# Patient Record
Sex: Female | Born: 1965 | Race: Black or African American | Hispanic: No | Marital: Married | Smoking: Current every day smoker
Health system: Southern US, Community
[De-identification: ages and names within clinical notes are randomized; demographics above are authoritative.]

## PROBLEM LIST (undated history)

## (undated) DIAGNOSIS — I10 Essential (primary) hypertension: Secondary | ICD-10-CM

## (undated) DIAGNOSIS — F1010 Alcohol abuse, uncomplicated: Secondary | ICD-10-CM

## (undated) DIAGNOSIS — Z720 Tobacco use: Secondary | ICD-10-CM

## (undated) DIAGNOSIS — K625 Hemorrhage of anus and rectum: Secondary | ICD-10-CM

## (undated) DIAGNOSIS — E079 Disorder of thyroid, unspecified: Secondary | ICD-10-CM

## (undated) HISTORY — PX: COLONOSCOPY: SHX174

## (undated) HISTORY — DX: Essential (primary) hypertension: I10

## (undated) HISTORY — DX: Disorder of thyroid, unspecified: E07.9

## (undated) HISTORY — DX: Hemorrhage of anus and rectum: K62.5

## (undated) HISTORY — PX: THYROIDECTOMY: SHX17

---

## 1999-03-23 HISTORY — PX: UPPER GASTROINTESTINAL ENDOSCOPY: SHX188

## 2002-03-22 HISTORY — PX: HEMORROIDECTOMY: SUR656

## 2002-09-21 ENCOUNTER — Emergency Department (HOSPITAL_COMMUNITY): Admission: EM | Admit: 2002-09-21 | Discharge: 2002-09-21 | Payer: Self-pay | Admitting: Emergency Medicine

## 2002-11-02 ENCOUNTER — Ambulatory Visit (HOSPITAL_COMMUNITY): Admission: RE | Admit: 2002-11-02 | Discharge: 2002-11-02 | Payer: Self-pay | Admitting: Gastroenterology

## 2002-11-02 ENCOUNTER — Encounter (INDEPENDENT_AMBULATORY_CARE_PROVIDER_SITE_OTHER): Payer: Self-pay | Admitting: *Deleted

## 2002-11-07 ENCOUNTER — Ambulatory Visit (HOSPITAL_COMMUNITY): Admission: RE | Admit: 2002-11-07 | Discharge: 2002-11-07 | Payer: Self-pay | Admitting: Gastroenterology

## 2002-11-08 ENCOUNTER — Encounter: Payer: Self-pay | Admitting: Gastroenterology

## 2002-11-08 ENCOUNTER — Encounter (INDEPENDENT_AMBULATORY_CARE_PROVIDER_SITE_OTHER): Payer: Self-pay | Admitting: *Deleted

## 2002-11-08 ENCOUNTER — Ambulatory Visit (HOSPITAL_COMMUNITY): Admission: RE | Admit: 2002-11-08 | Discharge: 2002-11-08 | Payer: Self-pay | Admitting: Gastroenterology

## 2002-11-23 ENCOUNTER — Encounter (INDEPENDENT_AMBULATORY_CARE_PROVIDER_SITE_OTHER): Payer: Self-pay | Admitting: Specialist

## 2002-11-23 ENCOUNTER — Observation Stay (HOSPITAL_COMMUNITY): Admission: RE | Admit: 2002-11-23 | Discharge: 2002-11-24 | Payer: Self-pay | Admitting: General Surgery

## 2002-11-23 ENCOUNTER — Encounter: Payer: Self-pay | Admitting: General Surgery

## 2002-12-13 ENCOUNTER — Ambulatory Visit (HOSPITAL_COMMUNITY): Admission: RE | Admit: 2002-12-13 | Discharge: 2002-12-13 | Payer: Self-pay | Admitting: General Surgery

## 2002-12-13 ENCOUNTER — Encounter: Payer: Self-pay | Admitting: General Surgery

## 2004-07-29 ENCOUNTER — Emergency Department (HOSPITAL_COMMUNITY): Admission: EM | Admit: 2004-07-29 | Discharge: 2004-07-29 | Payer: Self-pay | Admitting: Emergency Medicine

## 2005-04-12 ENCOUNTER — Ambulatory Visit (HOSPITAL_COMMUNITY): Admission: RE | Admit: 2005-04-12 | Discharge: 2005-04-12 | Payer: Self-pay | Admitting: Obstetrics and Gynecology

## 2005-04-21 ENCOUNTER — Other Ambulatory Visit: Admission: RE | Admit: 2005-04-21 | Discharge: 2005-04-21 | Payer: Self-pay | Admitting: Obstetrics and Gynecology

## 2007-08-16 ENCOUNTER — Ambulatory Visit (HOSPITAL_COMMUNITY): Admission: RE | Admit: 2007-08-16 | Discharge: 2007-08-16 | Payer: Self-pay | Admitting: Pediatrics

## 2008-05-21 ENCOUNTER — Ambulatory Visit (HOSPITAL_COMMUNITY): Admission: RE | Admit: 2008-05-21 | Discharge: 2008-05-21 | Payer: Self-pay | Admitting: Family Medicine

## 2008-05-30 ENCOUNTER — Ambulatory Visit (HOSPITAL_COMMUNITY): Admission: RE | Admit: 2008-05-30 | Discharge: 2008-05-30 | Payer: Self-pay | Admitting: Family Medicine

## 2008-11-05 ENCOUNTER — Ambulatory Visit (HOSPITAL_COMMUNITY): Admission: RE | Admit: 2008-11-05 | Discharge: 2008-11-05 | Payer: Self-pay | Admitting: Obstetrics and Gynecology

## 2008-11-13 ENCOUNTER — Other Ambulatory Visit: Admission: RE | Admit: 2008-11-13 | Discharge: 2008-11-13 | Payer: Self-pay | Admitting: Obstetrics and Gynecology

## 2009-01-09 ENCOUNTER — Ambulatory Visit (HOSPITAL_COMMUNITY): Admission: RE | Admit: 2009-01-09 | Discharge: 2009-01-09 | Payer: Self-pay | Admitting: Family Medicine

## 2009-03-22 HISTORY — PX: ABDOMINAL HYSTERECTOMY: SHX81

## 2009-03-31 ENCOUNTER — Ambulatory Visit (HOSPITAL_COMMUNITY): Admission: RE | Admit: 2009-03-31 | Discharge: 2009-03-31 | Payer: Self-pay | Admitting: Obstetrics and Gynecology

## 2009-04-22 ENCOUNTER — Inpatient Hospital Stay (HOSPITAL_COMMUNITY)
Admission: RE | Admit: 2009-04-22 | Discharge: 2009-04-24 | Payer: Self-pay | Source: Home / Self Care | Admitting: Obstetrics and Gynecology

## 2009-04-22 ENCOUNTER — Encounter: Payer: Self-pay | Admitting: Obstetrics and Gynecology

## 2010-03-17 ENCOUNTER — Ambulatory Visit: Payer: Self-pay | Admitting: Internal Medicine

## 2010-04-09 ENCOUNTER — Ambulatory Visit (HOSPITAL_COMMUNITY): Admission: RE | Admit: 2010-04-09 | Payer: Self-pay | Source: Home / Self Care | Admitting: Internal Medicine

## 2010-04-11 ENCOUNTER — Encounter: Payer: Self-pay | Admitting: Family Medicine

## 2010-05-18 ENCOUNTER — Encounter: Payer: Self-pay | Admitting: Orthopedic Surgery

## 2010-05-18 ENCOUNTER — Ambulatory Visit (INDEPENDENT_AMBULATORY_CARE_PROVIDER_SITE_OTHER): Payer: 59 | Admitting: Orthopedic Surgery

## 2010-05-18 DIAGNOSIS — M6530 Trigger finger, unspecified finger: Secondary | ICD-10-CM | POA: Insufficient documentation

## 2010-05-20 ENCOUNTER — Encounter (HOSPITAL_BASED_OUTPATIENT_CLINIC_OR_DEPARTMENT_OTHER): Payer: 59 | Admitting: Internal Medicine

## 2010-05-20 ENCOUNTER — Other Ambulatory Visit (INDEPENDENT_AMBULATORY_CARE_PROVIDER_SITE_OTHER): Payer: Self-pay | Admitting: Internal Medicine

## 2010-05-20 ENCOUNTER — Ambulatory Visit (HOSPITAL_COMMUNITY)
Admission: RE | Admit: 2010-05-20 | Discharge: 2010-05-20 | Disposition: A | Discharge status: Home / Self Care | Payer: 59 | Source: Ambulatory Visit | Attending: Internal Medicine | Admitting: Internal Medicine

## 2010-05-20 DIAGNOSIS — K921 Melena: Secondary | ICD-10-CM | POA: Insufficient documentation

## 2010-05-20 DIAGNOSIS — I10 Essential (primary) hypertension: Secondary | ICD-10-CM | POA: Insufficient documentation

## 2010-05-20 DIAGNOSIS — Z86010 Personal history of colonic polyps: Secondary | ICD-10-CM

## 2010-05-20 DIAGNOSIS — K5900 Constipation, unspecified: Secondary | ICD-10-CM

## 2010-05-20 DIAGNOSIS — Z79899 Other long term (current) drug therapy: Secondary | ICD-10-CM | POA: Insufficient documentation

## 2010-05-20 DIAGNOSIS — D126 Benign neoplasm of colon, unspecified: Secondary | ICD-10-CM | POA: Insufficient documentation

## 2010-05-28 NOTE — Assessment & Plan Note (Signed)
Summary: trigger finger   Visit Type:  new patient Referring Provider:  self Primary Provider:  na  CC:  trigger finger.  History of Present Illness: I saw [name] in the office today for an initial visit.  She is a 45 years old woman with the complaint of:  right ring finger triggering.  No injections.  Levoxyl, Metoprolol, Setraline.  An 45 years old female with pain in the RIGHT ring finger since December of last year, which started gradually progressed to sharp, dull, 6/10 discomfort with triggering and locking.    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 Cardiovascular:  Denies chest pain, palpitations, fainting, and murmurs. Respiratory:  Denies short of breath, wheezing, couch, tightness, pain on inspiration, and snoring . Gastrointestinal:  Complains of blood in your stools; denies heartburn, nausea, vomiting, diarrhea, and constipation. Genitourinary:  Denies frequency, urgency, difficulty urinating, painful urination, flank pain, and bleeding in urine. Neurologic:  Denies numbness, tingling, unsteady gait, dizziness, tremors, and seizure. Musculoskeletal:  Complains of joint pain; denies swelling, instability, stiffness, redness, heat, and muscle pain. Endocrine:  Denies excessive thirst, exessive urination, and heat or cold intolerance. Psychiatric:  Complains of anxiety; denies nervousness, depression, and hallucinations. Skin:  Denies changes in the skin, poor healing, rash, itching, and redness. HEENT:  Denies blurred or double vision, eye pain, redness, and  watering. Immunology:  Denies seasonal allergies, sinus problems, and allergic to bee stings. Hemoatologic:  Denies easy bleeding and brusing.  Physical Exam  Skin:  intact without lesions or rashes Psych:  alert and cooperative; normal mood and affect; normal attention span and concentration   Wrist/Hand Exam  General:    Well-developed, well-nourished, in no acute distress; alert and oriented x 3.    Skin:    Intact with no erythema; no scarring.    Inspection:    swelling at the A1 pulley, RIGHT ring  Palpation:    tenderness on the A1 pulley of the RIGHT ring finger  Vascular:    normal perfusion of the RIGHT hand  Sensory:    normal sensation, RIGHT hand  Motor:    normal flexion, extension, fingers, RIGHT hand   Impression & Recommendations:  Problem # 1:  TRIGGER FINGER (ICD-727.03) Assessment New  trigger RIGHT ring finger.  Injection Verbal consent was obtained: The finger was prepped with ethyl chloride and injected with 1:1 injection of .25% sensorcaine, 1cc  and 40 mg of depomedrol, 1cc. There were no complications.  Orders: New Patient Level II ([address]) Injection, Tendon / Ligament (60454)  Patient Instructions: 1)  You have received an injection of cortisone today. You may experience increased pain at the injection site. Apply ice pack to the area for 20 minutes every 2 hours and take 2 xtra strength tylenol every 8 hours. This increased pain will usually resolve in 24 hours. The injection will take effect in 3-10 days.  2)  Please schedule a follow-up appointment as needed.   Orders Added: 1)  New Patient Level II [99202] 2)  Injection, Tendon / Ligament [20550]

## 2010-05-28 NOTE — Letter (Signed)
Summary: Work [facility]  [address]  [place], [place] [address]   Phone: [phone]  Fax: [phone]     Today's Date: May 18, 2010  Name of Patient: [name] [facility]  The above named patient had a medical visit today at:  4:15 pm.  Please take this into consideration when reviewing the time away from work.           Start:  05/19/10          End:  05/20/10  - return to the normal work schedule                Sincerely yours,   Terrance Mass, MD

## 2010-05-29 ENCOUNTER — Encounter: Payer: Self-pay | Admitting: Orthopedic Surgery

## 2010-05-29 ENCOUNTER — Ambulatory Visit (HOSPITAL_COMMUNITY)
Admission: RE | Admit: 2010-05-29 | Discharge: 2010-05-29 | Disposition: A | Discharge status: Home / Self Care | Payer: 59 | Source: Ambulatory Visit | Attending: Internal Medicine | Admitting: Internal Medicine

## 2010-05-29 DIAGNOSIS — I10 Essential (primary) hypertension: Secondary | ICD-10-CM | POA: Insufficient documentation

## 2010-05-29 DIAGNOSIS — Z79899 Other long term (current) drug therapy: Secondary | ICD-10-CM | POA: Insufficient documentation

## 2010-05-29 DIAGNOSIS — Y838 Other surgical procedures as the cause of abnormal reaction of the patient, or of later complication, without mention of misadventure at the time of the procedure: Secondary | ICD-10-CM | POA: Insufficient documentation

## 2010-05-29 DIAGNOSIS — IMO0002 Reserved for concepts with insufficient information to code with codable children: Secondary | ICD-10-CM

## 2010-06-01 LAB — CONVERTED CEMR LAB
MCV: 83.8 fL (ref 78.0–100.0)
Platelets: 262 10*3/uL (ref 150–400)
RBC: 3.76 M/uL — ABNORMAL LOW (ref 3.87–5.11)
WBC: 5.7 10*3/uL (ref 4.0–10.5)

## 2010-06-02 NOTE — Letter (Signed)
Summary: History form  History form   Imported By: Jacklynn Ganong 05/28/2010 16:15:38  _____________________________________________________________________  External Attachment:    Type:   Image     Comment:   External Document

## 2010-06-06 NOTE — Op Note (Signed)
  [name], [name]           ACCOUNT NO.:  [id number]  MEDICAL RECORD NO.:  [id number]           PATIENT TYPE:  O  LOCATION:  DAYP                          FACILITY:  [facility]  PHYSICIAN:  Lionel December, M.D.    DATE OF BIRTH:  1965-06-24  DATE OF PROCEDURE:  05/20/2010 DATE OF DISCHARGE:                              OPERATIVE REPORT   PROCEDURE:  Colonoscopy with snare polypectomy.  INDICATIONS:  The patient is a 45 year old African American female, is an Charity fundraiser, who had colonoscopy by Dr. Loreta Ave about 6 years ago with removal of 2 adenomas.  She was advised to return for repeat exam in 3 years, but she decided not to until recently when she noticed some blood with her bowel movements when she has been constipated.  Family history is negative for colorectal carcinoma.  Procedure risks were reviewed with the patient and informed consent was obtained.  MEDICATIONS FOR CONSCIOUS SEDATION:  Demerol 50 mg IV, Versed 10 mg IV.  FINDINGS:  Procedure performed in endoscopy suite.  The patient's vital signs and O2 saturations were monitored during the procedure and remained stable.  The patient was placed in left lateral recumbent position.  Rectal examination was performed.  No abnormality noted on external or digital exam.  Pentax videoscope was placed through rectum and advanced under vision into sigmoid colon and beyond.  Preparation was satisfactory.  Scope was passed to cecum which was identified by appendiceal orifice and ileocecal valve.  Pictures were taken for the record.  There was a 15-mm broad-based polyp on a fold at ascending colon.  This polyp was snared.  It was snared in 2 pieces and polypectomy was complete.  Smaller part was suctioned through the channel and other one was caught with Lucina Mellow net and retrieved.  Endoscope was passed again and rest of the colonic mucosa carefully examined.  No other polyps were noted.  Rectal mucosa was normal.  Scope was retroflexed to  examine anorectal junction and moderate-sized hemorrhoids noted below the dentate line.  Endoscope was straightened and withdrawn. Withdrawal time was 11.5 minutes.  The patient tolerated the procedure well.  FINAL DIAGNOSES: 1. Examination performed to cecum. 2. A 15-mm polyp snared from the ascending colon. 3. External hemorrhoids.  RECOMMENDATIONS: 1. Standard instructions given. 2. High-fiber diet. 3. No aspirin for 2 weeks.  I will be contacting the patient with results of biopsy and further recommendations.     Lionel December, M.D.     NR/MEDQ  D:  05/20/2010  T:  05/20/2010  Job:  [id number]  cc:   Francoise Schaumann. Benjamine Mola, FAAP Fax: [phone]  Electronically Signed by Lionel December M.D. on 06/06/2010 02:29:34 PM

## 2010-06-06 NOTE — Op Note (Signed)
NAMEVERNONA, PEAKE           ACCOUNT NO.:  [id number]  MEDICAL RECORD NO.:  [id number]           PATIENT TYPE:  O  LOCATION:  DAYP                          FACILITY:  [facility]  PHYSICIAN:  Lionel December, M.D.    DATE OF BIRTH:  04/09/1965  DATE OF PROCEDURE:  05/29/2010 DATE OF DISCHARGE:                              OPERATIVE REPORT   PROCEDURE:  Colonoscopy with 3 hemoclip application at polypectomy site.  INDICATION:  The patient is a 45 year old Caucasian female with polypectomy on May 20, 2010, with removal of a single polyp for her ascending colon via snare polypectomy.  She started to pass blood last night.  She has not had any postural symptoms.  She has experienced some bloating cramps.  Her H and H was checked as 10.5 and 31.5.  She is undergoing therapeutic procedure.  Procedure risks were reviewed with the patient and informed consent was obtained.  MEDICATIONS FOR CONSCIOUS SEDATION:  1,  Demerol 50 mg IV.1. Versed 10 mg IV.  FINDINGS:  Procedure performed in endoscopy suite.  The patient's vital signs and O2 sats were monitored during the procedure and remained stable.  The patient was placed in left lateral recumbent position and rectal examination performed.  No abnormality noted on external or digital exam.  Pentax videoscope was placed through rectum and advanced under vision into sigmoid colon and beyond.  She had blood tinged liquid throughout the colon in the region of ascending colon was little bit more concentrated.  Most of this suctioned out.  Scope was passed into cecum which was identified by appendiceal orifice and ileocecal valve. Short segment of GI was also examined and was normal.  Attention was then directed to the polypectomy site.  A 2 dot specks of reddish area, however, no active bleeding was noted.  Site was washed with jet of water and no bleeding was induced.  Nevertheless, I felt that she must have bled from the polypectomy site  and just stopped.  Three hemoclips were applied to the polypectomy site for hemostasis.  The polypectomy margins were coapted.  No bleeding was induced during this process. Pictures were taken for the record.  Rest of the mucosa was once again carefully examined and no other abnormalities were noted.  Most of the liquid was suctioned out.  The patient tolerated the procedure well.  FINAL DIAGNOSES: 1. Examination performed of the cecum and terminal ileum. 2. Blood-tinged prep or fluid throughout the colon. 3. No active bleeding noted from the polypectomy site.  Had couple of     red dots.  Three hemoclips applied for hemostasis.  RECOMMENDATIONS: 1. The patient advised to rest at home for the next 24 hours.  She can     resume her usual diet.  No aspirin or NSAIDs. 2. The patient advised to hold, not to undergo MRI until she has been     documented to have past these hemoclips.     Lionel December, M.D.     NR/MEDQ  D:  05/29/2010  T:  05/29/2010  Job:  [id number]  cc:   Francoise Schaumann. Benjamine Mola, FAAP Fax: [phone]  Electronically  Signed by Lionel December M.D. on 06/06/2010 02:30:06 PM

## 2010-06-07 LAB — CBC
HCT: 39 % (ref 36.0–46.0)
Hemoglobin: 12.4 g/dL (ref 12.0–15.0)
MCHC: 31.7 g/dL (ref 30.0–36.0)
Platelets: 202 10*3/uL (ref 150–400)
RDW: 28.2 % — ABNORMAL HIGH (ref 11.5–15.5)

## 2010-06-07 LAB — COMPREHENSIVE METABOLIC PANEL
Albumin: 4.3 g/dL (ref 3.5–5.2)
BUN: 10 mg/dL (ref 6–23)
Calcium: 10.3 mg/dL (ref 8.4–10.5)
Creatinine, Ser: 0.51 mg/dL (ref 0.4–1.2)
Glucose, Bld: 92 mg/dL (ref 70–99)
Potassium: 4.3 mEq/L (ref 3.5–5.1)
Total Protein: 7 g/dL (ref 6.0–8.3)

## 2010-06-07 LAB — TYPE AND SCREEN
ABO/RH(D): O POS
Antibody Screen: NEGATIVE

## 2010-06-07 LAB — HEMOGLOBIN AND HEMATOCRIT, BLOOD
HCT: 31.6 % — ABNORMAL LOW (ref 36.0–46.0)
Hemoglobin: 9.6 g/dL — ABNORMAL LOW (ref 12.0–15.0)

## 2010-06-07 LAB — HCG, QUANTITATIVE, PREGNANCY: hCG, Beta Chain, Quant, S: <2 m[IU]/mL (ref <5)

## 2010-06-10 LAB — DIFFERENTIAL
Band Neutrophils: 0 % (ref 0–10)
Basophils Absolute: 0 10*3/uL (ref 0.0–0.1)
Blasts: 0 %
Eosinophils Absolute: 0.1 10*3/uL (ref 0.0–0.7)
Lymphocytes Relative: 19 % (ref 12–46)
Monocytes Absolute: 0.1 10*3/uL (ref 0.1–1.0)
Monocytes Relative: 2 % — ABNORMAL LOW (ref 3–12)
Promyelocytes Absolute: 0 %
nRBC: 0 /100 WBC

## 2010-06-10 LAB — CBC
HCT: 33.6 % — ABNORMAL LOW (ref 36.0–46.0)
Hemoglobin: 10.8 g/dL — ABNORMAL LOW (ref 12.0–15.0)
MCV: 79.9 fL (ref 78.0–100.0)
Platelets: 184 10*3/uL (ref 150–400)
RBC: 4.2 MIL/uL (ref 3.87–5.11)
WBC: 7.8 10*3/uL (ref 4.0–10.5)

## 2010-07-02 ENCOUNTER — Other Ambulatory Visit (INDEPENDENT_AMBULATORY_CARE_PROVIDER_SITE_OTHER): Payer: Self-pay | Admitting: Internal Medicine

## 2010-07-02 LAB — CBC WITH DIFFERENTIAL/PLATELET
Basophils Relative: 2 % — ABNORMAL HIGH (ref 0–1)
Eosinophils Absolute: 0.2 10*3/uL (ref 0.0–0.7)
Lymphs Abs: 1 10*3/uL (ref 0.7–4.0)
MCH: 28.7 pg (ref 26.0–34.0)
MCHC: 32.7 g/dL (ref 30.0–36.0)
Neutro Abs: 3 10*3/uL (ref 1.7–7.7)
Neutrophils Relative %: 63 % (ref 43–77)
Platelets: 228 10*3/uL (ref 150–400)
RBC: 4.15 MIL/uL (ref 3.87–5.11)

## 2010-08-07 NOTE — Op Note (Signed)
NAME:  [name], [name]                     ACCOUNT NO.:  [id number]   MEDICAL RECORD NO.:  [id number]                   PATIENT TYPE:  AMB   LOCATION:                                       FACILITY:  [facility]   PHYSICIAN:  Anselmo Rod, M.D.               DATE OF BIRTH:  [birth year]   DATE OF PROCEDURE:  11/07/2002  DATE OF DISCHARGE:                                 OPERATIVE REPORT   PROCEDURE:  Esophagogastroduodenoscopy with biopsy.   ENDOSCOPIST:  Charna Elizabeth, M.D.   INSTRUMENT USED:  Olympus video panendoscope.   INDICATIONS FOR PROCEDURE:  45 year old African American female with a  history of rectal bleeding and polyp snared from the rectum and a small  lesion at the anal verge undergoing EGD to rule out peptic ulcer disease,  esophagitis, gastritis, etc.  The patient has had ongoing rectal bleeding.   PREPROCEDURE PREPARATION:  Informed consent was obtained from the patient.  The patient's hemoglobin was checked, it was 10.1 grams for the procedure.  The patient was fasted for four hours prior to the procedure.   PREPROCEDURE PHYSICAL:  Patient with stable vital signs.  Neck supple.  Chest clear to auscultation.  S1 and S2 regular.  Abdomen soft, obese, with  some right lower quadrant and periumbilical tenderness to palpation with  guarding, no rebound or rigidity.  No hepatosplenomegaly.  The patient has  some pigmentation on her skin from vitiligo.   DESCRIPTION OF PROCEDURE:  The patient was placed in the left lateral  decubitus position, sedated with 100 mg of Demerol and 8 mg Versed  intravenously.  Once the patient was adequately sedated, maintained on low  flow oxygen, continuous cardiac monitoring, the Olympus video panendoscope  was advanced through the mouth piece over the tongue into the esophagus  under direct vision.  The entire esophagus appeared normal with no evidence  of ring, strictures, masses, esophagitis, or Barrett's mucosa.  The scope  was  then advanced into the stomach.  There was significant inflammatory  change noted in the proximal portion of the stomach, fundus, high cardia,  and mid body.  The antrum appeared relatively healthy.  Multiple biopsies  were done.  The etiology of this seems to be unclear.  Biopsies were done to  rule out H. pylori versus IBD.  The duodenal bulb and proximal small bowel  distal to the bulb appeared normal up to 60 cm, there was no obstruction.  The patient tolerated the procedure well without complications.  No hiatal  hernia was seen.  A rectal exam was repeated after the EGD, the scope was  gently inserted into the rectum.  Well formed stool was noted in the rectum  with no evidence of fresh blood.  The patient had some pigmented changes in  the mucosa around the rectum from vitiligo.  No anal fissure was seen.  Some  hemorrhoids were identified, these were not  bleeding at the time of  examination.   IMPRESSION:  1. Severely, edematous, inflamed mucosa in the proximal stomach, biopsies     done, results pending.  2. Normal appearing esophagus and proximal small bowel.    RECOMMENDATIONS:  1. Await pathology results.  2. CT scan of the abdomen and pelvis to further evaluate the patient's pain     and anemia.  3. Further recommendations made thereafter.                                               Anselmo Rod, M.D.    JNM/MEDQ  D:  11/08/2002  T:  11/08/2002  Job:  [id number]   cc:   Margaretmary Bayley, M.D.  [address]  [place]  [place] [address]  Fax: [phone]   Adolph Pollack, M.D.  [address]  [place]  [place] [address]  Fax: [phone]

## 2010-08-07 NOTE — Op Note (Signed)
NAME:  [name], [name]                     ACCOUNT NO.:  [id number]   MEDICAL RECORD NO.:  [id number]                   PATIENT TYPE:  AMB   LOCATION:  ENDO                                 FACILITY:  [facility]   PHYSICIAN:  Anselmo Rod, M.D.               DATE OF BIRTH:  [birth year]   DATE OF PROCEDURE:  11/02/2002  DATE OF DISCHARGE:                                 OPERATIVE REPORT   PROCEDURE PERFORMED:  Colonoscopy with snare polypectomy x1.   ENDOSCOPIST:  Anselmo Rod, M.D.   INSTRUMENT USED:  Olympus video colonoscope.   INDICATION FOR PROCEDURE:  A 45 year old African-American female with a four-  month history of rectal bleeding, worsened in the last week.  The patient  gives a history of excessive bleeding with BMs a week ago and then today.  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 Sung Amabile, M.D.   PREPROCEDURE PREPARATION:  Informed consent was procured from the patient.  The patient was fasted for hours prior to the procedure and prepped with two  bottles of Fleet's Phospho-Soda and two Fleet's enemas the morning of the  procedure, as this was done on a semi-emergent basis.  The patient's basic  labs were checked, including a CBC, CMET, PT, and a PTT.  Her hemoglobin was  11.2, and coagulation studies were normal.   PREPROCEDURE PHYSICAL:  VITAL SIGNS:  The patient had stable vital signs.  HEENT:  [place], PERRLA.  Facial symmetry preserved.  NECK:  Supple with a surgical care present in the base of the neck from a  previous thyroidectomy.  CHEST:  Clear to auscultation.  S1, S2 regular.  ABDOMEN:  Soft, obese, nondistended, with no evidence of hepatosplenomegaly,  no masses palpable.  RECTAL:  Moderate sphincter tone with guaiac-positive brown stools, no other  masses palpable on digital examination.   DESCRIPTION OF PROCEDURE:  The patient was placed in the left lateral  decubitus position and sedated with 100 mg of  Demerol and 8 mg of Versed  intravenously in slow incremental doses.  Once the patient was adequately  sedate and maintained on low-flow oxygen and continuous cardiac monitoring,  the Olympus video colonoscope was advanced from the rectum to the cecum with  difficulty.  There was a large amount of stool in the colon, both formed and  liquid.  Multiple washes were done.  The patient's position was changed from  the left lateral to the supine and the right lateral position on several  occasions with gentle application of abdominal pressure to reach the cecum.  The appendiceal orifice and the ileocecal valve were visualized and  photographed.  Careful inspection was done but as there was inadequate prep,  small lesions could have been missed.  A small sessile polyp was snared from  the rectum.  Retroflexion in the rectum revealed papillomatous lesion at the  anal verge.  This was not biopsied or removed, as it seemed to lie over the  hemorrhoidal plexus.  Small internal hemorrhoids were noticed on  retroflexion as well.  There was no evidence of diverticulosis.  No other  masses or polyps were seen except for the ones mentioned above; however,  small lesions could be missed.   IMPRESSION:  1. Small sessile polyp snared from the rectum.  2. Papillomatous lesion at the anal verge, not biopsied or removed.  3. Internal hemorrhoid.   RECOMMENDATIONS:  1. Await pathology results.  2. Surgical evaluation for removal of the small lesion at the anal verge.  3. Continue serial CBCs and outpatient follow-up within the next week.                                               Anselmo Rod, M.D.    JNM/MEDQ  D:  11/03/2002  T:  11/03/2002  Job:  [id number]   cc:   Margaretmary Bayley, M.D.  [address], [address]  [place]  [place] [address]  Fax: [phone]

## 2010-08-07 NOTE — Consult Note (Signed)
NAME:  [name], [name]                     ACCOUNT NO.:  [id number]   MEDICAL RECORD NO.:  [id number]                   PATIENT TYPE:  AMB   LOCATION:  ENDO                                 FACILITY:  [facility]   PHYSICIAN:  Anselmo Rod, M.D.               DATE OF BIRTH:  [birth year]   DATE OF CONSULTATION:  11/02/2002  DATE OF DISCHARGE:                                   CONSULTATION   REASON FOR CONSULTATION:  Rectal bleeding for the last four months.   HISTORY OF PRESENT ILLNESS:  The patient is a 45 year old African American  female employee of [employer], referred to me by a coworker, Dr.  Juleen China, for emergency evaluation of hematochezia.  The patient gives  a four-month history of rectal bleeding.  She noted a small amount of blood  in her stools about four months ago on a couple of occasions, and then a  significant amount of rectal bleeding after a bowel movement about one week  ago.  She claims the bleeding filled up the commode and made her very  anxious and afraid of what the possibilities could be.  The patient denied  any melena associated with the stools, some right lower-quadrant discomfort,  but no nausea, vomiting, fever, chills or rigors.  The patient had  recurrence of these symptoms earlier today and requested emergency  evaluation.  She denied any dizziness or syncope.  She has no shortness of  breath, chest pain, genitourinary or cardiovascular complaints.  There is no  history of ENT, dental or ocular complaints. She has a history of headache,  but does not consume nonsteroidals in large amounts. This may occur about  once a month.  There are no C&S, psychiatric or allergic complaints.  She  has depigmentation of the skin, question of vitiligo for the last five or  six years.  She is being followed by Dr. Campbell Stall for the same, and  claims that her skin is improving with the application of topical  medications.   She denies a history of  reflux, dysphagia, odynophagia, ulcers, jaundice or  colitis.  She tends to have bouts of constipation and does not consume  enough fluid and fiber in her diet.  There is no history of hemorrhoids or  anal fissures, abnormal weight loss or weight gain except for a 15 pound  weight loss in the last couple of months after she started the Atkin's diet.  She has, however, stopped following the diet two weeks ago and may have  gained some of the weight back.  She denies history of anemia. Blood  transfusions or tattoos.   ALLERGIES:  No known drug allergies.   MEDICATIONS:  Levoxyl.   PAST MEDICAL HISTORY:  1. Two normal vaginal deliveries, 15 and 17 years ago.  2. Graves disease diagnosed seven years ago, resulting in thyroidectomy.  3. History of vitiligo under the care of Dr.  Hope Danella Deis.   FAMILY HISTORY:  Father died of renal failure complicated by myocardial  infarction at age 107.  Her mother is 63 and has hypertension.  She has three  younger brothers.  One of her brothers has renal insufficiency.  Another  brother has hypertension.  There is no known family history of breast,  ovarian, uterine or colon cancer.   REVIEW OF SYSTEMS:  1. Bright red blood per rectum for the last four months, worse in the last     two weeks.  2. No history of melena.  3. Some right lower quadrant discomfort in the recent past.  The patient     denies nausea, vomiting, fever, chills or rigors.  4. No dizziness, syncope, or near syncopal events.   PHYSICAL EXAMINATION:  GENERAL:  Reveals a very-pleasant, young [place]  American female, in no acute distress, very anxious and obviously afraid.  VITAL SIGNS:  Temperature 98.6, blood pressure 120/84, pulse 84 per minute,  respiratory rate 18.  HEENT:  Oropharyngeal mucosa without lesion.  NECK:  Supple, no JVD, thyromegaly or lymphadenopathy.  There is a surgical  scar present at the base of the neck from previous thyroidectomy.  CHEST:  Clear to  auscultation.  HEART:  S1 is regular, no murmur, rub or gallop.  ABDOMEN:  Obese, nondistended, nontender with normal bowel sounds, no  hepatosplenomegaly, no masses palpable.  RECTAL:  Examination with moderate sphincter tone, with brown, guaiac-  positive stool.  No other masses palpable on digital examination.  SKIN:  Deep pigmented in small patches over the middle of the lower torso,  arms and legs, question of vitiligo.   ASSESSMENT/PLAN:  1. Hematochezia.  Considering the patient's history of rectal bleeding in     the last four months, worse in the last couple of episodes and then again     today, an emergency colonoscopy has been planned for her.  Basic labs     were checked.  Hemoglobin was 11.2, hematocrit 33.7, MCV 76.9, white     count 4.9 K.  PT 13.4, INR 1.  PTT 31.  Sodium 145, potassium 3.8,     chloride 112, CO2 22, glucose 105, BUN 8, creatinine 0.6.  Total     bilirubin 0.4.  Direct bilirubin 0.1.  Alkaline phosphatase 78, AST 90,     ALT 16, total protein 7.5, albumin 4, calcium 3.1.   Further recommendations made after the colonoscopy has been done.  1. History of Graves disease, status post partial thyroidectomy, presently     on Levoxyl.  2. Obesity.  3. Vitiligo, under the care of Dr. Campbell Stall.                                               Anselmo Rod, M.D.    JNM/MEDQ  D:  11/03/2002  T:  11/03/2002  Job:  [id number]   cc:   Margaretmary Bayley, M.D.  [address], [address]  [place]  [place] [address]  Fax: [phone]   [facility]. Danella Deis, M.D.  [address]  [place]  [place] [address]  Fax: [phone]

## 2010-08-07 NOTE — Op Note (Signed)
NAME:  [name], [name]                     ACCOUNT NO.:  [id number]   MEDICAL RECORD NO.:  [id number]                   PATIENT TYPE:  OBV   LOCATION:  0444                                 FACILITY:  [facility]   PHYSICIAN:  Adolph Pollack, M.D.            DATE OF BIRTH:  Jul 24, 1965   DATE OF PROCEDURE:  11/23/2002  DATE OF DISCHARGE:                                 OPERATIVE REPORT   PREOPERATIVE DIAGNOSIS:  Rectal bleeding with anemia and internal  hemorrhoids.   POSTOPERATIVE DIAGNOSIS:  Rectal bleeding with anemia and internal  hemorrhoids with right posterior lateral anal mass.   OPERATION/PROCEDURE:  1. Examination under anesthesia with anoscopy.  2. Left lateral hemorrhoidectomy, external and internal.  3. Posterior column internal hemorrhoidectomy.  4. Rubber band ligation, right posterior lateral internal hemorrhoid.  5. Excision of right anterior lateral anal mass.   SURGEON:  Adolph Pollack, M.D.   ANESTHESIA:  General.   INDICATIONS:  This is a 45 year old female who has been having some rectal  bleeding.  She has had a thorough evaluation by Dr. Loreta Ave including  colonoscopy.  She has had a noted hemoglobin drop.  Most of the bleeding is  bright red blood.  No suspicious colorectal masses have been noted but  internal hemorrhoids have been noted and I noted them in my office.  She had  one inflamed when I saw her in the office.  She states for the past four  days, she has had not any further bleeding but her hemoglobin is down to  9.4.  Also of note on a preoperative chest x-ray, she has a vague density in  the right lung.  Did discuss this with her preoperatively in the holding  area.  She now presents for the above procedure.   DESCRIPTION OF PROCEDURE:  She is seen in the holding area, then brought to  the operating room and placed supine on the stretcher and the general  anesthesia was administered.  She is then turned prone on the operating  table  and appropriate areas padded.  She is placed in the jackknife  position.  The buttocks were retracted with tape and a perianal area was  sterilely prepped and draped.  Vitiligo-type changes were seen in the  perianal area.  On digital rectal exam, no masses were felt.  Anoscopy was  performed and I noted that in the left lateral area, she had a large  internal hemorrhoid with an external component as well.  I examined a right  posterolateral area and an internal hemorrhoid was noted there.  In the  right anterolateral area an irregular anal mass was noted.  There were an  extra hemorrhoidal column in the posterior region with a single internal  hemorrhoid.  I saw no lesions or no active bleeding.   I first approached the left lateral hemorrhoid.  I exposed it.  I used a 2-0  chromic suture to  ligate the vascular pedicle.  I then injected 0.5%  Marcaine with epinephrine and excised the hemorrhoid external and internal  component sharply.  Bleeding was controlled with the cautery.  I  reapproximated the mucosa and anoderm with a running locking 2-0 chromic  suture.  Next, I visualized the posterior column internal hemorrhoid.  I  went ahead and placed a hemostat across this base and excised it.  I then  used a single figure-of-eight to approximate the mucosa here which was  hemostatic.   The right posterolateral internal hemorrhoid was visualized.  It was fairly  focal and the rubber band was placed over the hemorrhoid with a rubber band  ligator.   Lastly, I examined the right anterior lateral mass which was irregular.  I  excised this with the cautery.   Following all this, the perianal block was 0.5% Marcaine with epinephrine  was performed.  Hemostasis was adequate.  A piece of Gelfoam into the rectal  vault.  A bulky dressing was applied.  She was subsequently returned back  supine on the operating table, extubated and taken to the recovery room in  satisfactory condition.  There  are no apparent complications.  Three  specimens were sent to pathology.  First the left lateral hemorrhoidal  specimen, the posterior column internal hemorrhoidal specimen, and the right  anterolateral anal mass.   PLAN:  I have given her instructions.  Will see her back in the office in  two weeks.  Prescription for Tylox was given.                                               Adolph Pollack, M.D.    Kari Baars  D:  11/23/2002  T:  11/23/2002  Job:  [id number]

## 2010-09-29 ENCOUNTER — Other Ambulatory Visit (HOSPITAL_COMMUNITY): Payer: Self-pay | Admitting: Pediatrics

## 2010-09-29 DIAGNOSIS — Z139 Encounter for screening, unspecified: Secondary | ICD-10-CM

## 2010-10-06 ENCOUNTER — Ambulatory Visit (HOSPITAL_COMMUNITY)
Admission: RE | Admit: 2010-10-06 | Discharge: 2010-10-06 | Disposition: A | Discharge status: Home / Self Care | Payer: 59 | Source: Ambulatory Visit | Attending: Pediatrics | Admitting: Pediatrics

## 2010-10-06 ENCOUNTER — Ambulatory Visit (HOSPITAL_COMMUNITY): Admission: RE | Admit: 2010-10-06 | Payer: 59 | Source: Ambulatory Visit

## 2010-10-06 DIAGNOSIS — Z139 Encounter for screening, unspecified: Secondary | ICD-10-CM

## 2010-10-06 DIAGNOSIS — Z1231 Encounter for screening mammogram for malignant neoplasm of breast: Secondary | ICD-10-CM | POA: Insufficient documentation

## 2012-06-13 ENCOUNTER — Other Ambulatory Visit (HOSPITAL_COMMUNITY): Payer: Self-pay | Admitting: Pulmonary Disease

## 2012-06-13 ENCOUNTER — Ambulatory Visit (HOSPITAL_COMMUNITY)
Admission: RE | Admit: 2012-06-13 | Discharge: 2012-06-13 | Disposition: A | Discharge status: Home / Self Care | Payer: 59 | Source: Ambulatory Visit | Attending: Pulmonary Disease | Admitting: Pulmonary Disease

## 2012-06-13 DIAGNOSIS — Z1231 Encounter for screening mammogram for malignant neoplasm of breast: Secondary | ICD-10-CM | POA: Insufficient documentation

## 2012-06-13 DIAGNOSIS — Z139 Encounter for screening, unspecified: Secondary | ICD-10-CM

## 2012-07-18 ENCOUNTER — Encounter (INDEPENDENT_AMBULATORY_CARE_PROVIDER_SITE_OTHER): Payer: Self-pay

## 2013-06-19 ENCOUNTER — Encounter: Payer: Self-pay | Admitting: Obstetrics and Gynecology

## 2013-06-20 ENCOUNTER — Telehealth: Payer: Self-pay | Admitting: Obstetrics and Gynecology

## 2013-06-20 NOTE — Telephone Encounter (Signed)
Pt seen yesterday [facility], and pt had URI symptoms warranting not continuing work duties. Given note to be out of work til 06/20/13. Pt has not improved adequately , and has not RTW. Pt asked to call office to discuss RTW.

## 2013-07-03 DIAGNOSIS — Z029 Encounter for administrative examinations, unspecified: Secondary | ICD-10-CM

## 2013-11-20 ENCOUNTER — Ambulatory Visit (INDEPENDENT_AMBULATORY_CARE_PROVIDER_SITE_OTHER): Payer: 59 | Admitting: Internal Medicine

## 2013-11-20 ENCOUNTER — Encounter (INDEPENDENT_AMBULATORY_CARE_PROVIDER_SITE_OTHER): Payer: Self-pay | Admitting: Internal Medicine

## 2013-11-20 VITALS — BP 118/80 | HR 76 | Temp 97.9°F | Resp 18 | Ht 65.5 in | Wt 162.2 lb

## 2013-11-20 DIAGNOSIS — I10 Essential (primary) hypertension: Secondary | ICD-10-CM | POA: Insufficient documentation

## 2013-11-20 DIAGNOSIS — E039 Hypothyroidism, unspecified: Secondary | ICD-10-CM | POA: Insufficient documentation

## 2013-11-20 DIAGNOSIS — R1032 Left lower quadrant pain: Secondary | ICD-10-CM

## 2013-11-20 DIAGNOSIS — K625 Hemorrhage of anus and rectum: Secondary | ICD-10-CM

## 2013-11-20 DIAGNOSIS — R1013 Epigastric pain: Secondary | ICD-10-CM

## 2013-11-20 DIAGNOSIS — R1031 Right lower quadrant pain: Secondary | ICD-10-CM

## 2013-11-20 DIAGNOSIS — Z860100 Personal history of colon polyps, unspecified: Secondary | ICD-10-CM | POA: Insufficient documentation

## 2013-11-20 DIAGNOSIS — R634 Abnormal weight loss: Secondary | ICD-10-CM

## 2013-11-20 LAB — CBC
HEMATOCRIT: 38.5 % (ref 36.0–46.0)
HEMOGLOBIN: 13 g/dL (ref 12.0–15.0)
MCH: 27 pg (ref 26.0–34.0)
MCHC: 33.8 g/dL (ref 30.0–36.0)
MCV: 79.9 fL (ref 78.0–100.0)
Platelets: 233 10*3/uL (ref 150–400)
RBC: 4.82 MIL/uL (ref 3.87–5.11)
RDW: 15.3 % (ref 11.5–15.5)
WBC: 4.6 10*3/uL (ref 4.0–10.5)

## 2013-11-20 MED ORDER — DICYCLOMINE HCL 20 MG PO TABS: 10.0000 mg | ORAL_TABLET | Freq: Three times a day (TID) | ORAL | Status: DC

## 2013-11-20 MED ORDER — HYDROCORTISONE ACETATE 25 MG RE SUPP: 25.0000 mg | Freq: Every day | RECTAL | Status: DC

## 2013-11-20 MED ORDER — DOCUSATE SODIUM 100 MG PO CAPS: 200.0000 mg | ORAL_CAPSULE | Freq: Every day | ORAL | Status: AC

## 2013-11-20 MED ORDER — DOCUSATE SODIUM 100 MG PO CAPS: 100.0000 mg | ORAL_CAPSULE | Freq: Every day | ORAL | Status: DC

## 2013-11-20 NOTE — Progress Notes (Signed)
Presenting complaint;  Abdominal pain of 3 months duration. Rectal bleeding and weight loss.  History of present illness;  Patient is a 48 year old African female who called the office yesterday and requested to be seen. I last saw her for colonoscopy with polypectomy and Fabry  She presents with 3 months history of pain across her lower abdomen and also in the epigastric region. Pain has gotten worse over the last 2 weeks. On few occasions pain has doubled her over primary located in right lower quadrant. She also has woken up with this pain at night. She has had frequent nausea poor appetite denies vomiting. She states she has lost 25 pounds in one year. She states weight loss is both voluntary and involuntary. She also complains of intermittent constipation and urgency. She's had 4 episodes of rectal bleeding in the last 3 months. On 2 occasions all she passed was moderate amount of fresh blood. She's had chills but no fever. She denies heartburn or dysphagia. She states she is under a lot of stress on account of her mother's illness and she has to attend to her needs. Her husband is unemployed and this is also source of worry for her. She is on alprazolam. She states is not helping and she has an appointment to see Dr. Luan Pulling later this month.  Current Medications: Outpatient Encounter Prescriptions as of 11/20/2013  Medication Sig  . ALPRAZolam (XANAX) 0.5 MG tablet Take 0.5 mg by mouth 3 (three) times daily as needed for anxiety.  . diphenhydrAMINE (BENADRYL) 25 MG tablet Take 25 mg by mouth as needed for allergies.  Marland Kitchen levothyroxine (SYNTHROID, LEVOTHROID) 200 MCG tablet Take 200 mcg by mouth daily before breakfast.  . metoprolol succinate (TOPROL-XL) 25 MG 24 hr tablet Take 25 mg by mouth daily.   Past medical history;  She had subtotal thyroidectomy for goiter about 20 years ago. His interval gland was destroyed with radio iodine and she has been on replacement therapy ever  since.  History of colonic adenomas. She had 2 adenomas removed colon in 2006(Dr.Mann). Last colonoscopy was in any pain on 05/20/2010 with removal of 15 mm tubular adenoma from ascending colon complicated by delayed bleeding 10 days later controlled with Hemoclip application. She did not require admission or transfusion.  History of vitiligo.  Hypertension of 2 years duration.  Hemorrhoidectomy in 2004.  Total abdominal hysterectomy in February 2011.  Allergies; Allergies  Allergen Reactions  . Morphine And Related Itching  . Penicillins Other (See Comments)    Patient states that it burned while being injected.    Family history; Father died of MI at age 62. He had gout and was on hemodialysis. Mother is 8 and has early dementia diabetes and hypertension. She has 3 brothers. 2 have hypertension and gout and one is in good health.  Social history; She is married and has 2 grownup sons. She's been working at: [employer] for the last 15 years and(presently at [employer]). She smokes about half a pack of cigarettes that she is on for 7 years. She drinks alcohol occasionally.   Objective: Blood pressure 118/80, pulse 76, temperature 97.9 F (36.6 C), temperature source Oral, resp. rate 18, height 5' 5.5" (1.664 m), weight 162 lb 3.2 oz (73.573 kg).  Patient is alert and in no acute distress. She has hypopigmented areas over her face and both hands. Conjunctiva is pink. Sclera is nonicteric Oropharyngeal mucosa is normal. No neck masses or thyromegaly noted. Cardiac exam with regular rhythm normal S1 and  S2. No murmur or gallop noted. Lungs are clear to auscultation. Abdomen is symmetrical. Bowel sounds are normal. No bruits noted. On palpation she has mild to moderate tenderness in right lower quadrant as well as mild tenderness epigastrium and left lower quadrant. No organomegaly or masses. Rectal examination deferred.  No LE edema or clubbing noted.    Assessment:  #1. Abdominal  pain. She has both epigastric and lower abdominal pain but most of her pain is concentrated in the right lower quadrant. Some of the features suggest irritable bowel syndrome but anorexia and weight loss is bothersome. #2. Rectal bleeding. She has history of colonic adenomas and last colonoscopy was in February 2012. Suspect bleeding is secondary to hemorrhoids. If bleeding persists she may need colonoscopy now rather than and February 2017 as planned. #3. Stress disorder relating to family matters. She is to see Dr. Luan Pulling and near future.   Plan:  Patient will go to the lab for CBC and comprehensive chemistry panel. Abdominopelvic CT with contrast. Dicyclomine 10 mg before each meal. Anusol-HC suppository 1 per rectum daily at bedtime for two weeks. Office visit in 8 weeks or earlier if needed.

## 2013-11-21 LAB — COMPREHENSIVE METABOLIC PANEL
ALBUMIN: 4.4 g/dL (ref 3.5–5.2)
ALT: 39 U/L — ABNORMAL HIGH (ref 0–35)
AST: 42 U/L — ABNORMAL HIGH (ref 0–37)
Alkaline Phosphatase: 72 U/L (ref 39–117)
BILIRUBIN TOTAL: 1.1 mg/dL (ref 0.2–1.2)
BUN: 11 mg/dL (ref 6–23)
CO2: 29 meq/L (ref 19–32)
Calcium: 10 mg/dL (ref 8.4–10.5)
Chloride: 98 mEq/L (ref 96–112)
Creat: 0.48 mg/dL — ABNORMAL LOW (ref 0.50–1.10)
GLUCOSE: 116 mg/dL — AB (ref 70–99)
Potassium: 4.3 mEq/L (ref 3.5–5.3)
Sodium: 136 mEq/L (ref 135–145)
Total Protein: 7.3 g/dL (ref 6.0–8.3)

## 2013-11-27 ENCOUNTER — Ambulatory Visit (HOSPITAL_COMMUNITY)
Admission: RE | Admit: 2013-11-27 | Discharge: 2013-11-27 | Disposition: A | Discharge status: Home / Self Care | Payer: 59 | Source: Ambulatory Visit | Attending: Internal Medicine | Admitting: Internal Medicine

## 2013-11-27 DIAGNOSIS — R109 Unspecified abdominal pain: Secondary | ICD-10-CM | POA: Insufficient documentation

## 2013-11-27 DIAGNOSIS — R1013 Epigastric pain: Secondary | ICD-10-CM

## 2013-11-27 DIAGNOSIS — R1031 Right lower quadrant pain: Secondary | ICD-10-CM

## 2013-11-27 DIAGNOSIS — R1032 Left lower quadrant pain: Secondary | ICD-10-CM

## 2013-11-27 DIAGNOSIS — K921 Melena: Secondary | ICD-10-CM | POA: Insufficient documentation

## 2013-11-27 DIAGNOSIS — R634 Abnormal weight loss: Secondary | ICD-10-CM

## 2013-11-27 MED ORDER — SODIUM CHLORIDE 0.9 % IJ SOLN
INTRAMUSCULAR | Status: AC
  Filled 2013-11-27: qty 30

## 2013-11-27 MED ORDER — SODIUM CHLORIDE 0.9 % IJ SOLN
INTRAMUSCULAR | Status: AC
  Filled 2013-11-27: qty 250

## 2013-11-27 MED ORDER — IOHEXOL 300 MG/ML  SOLN
100.0000 mL | Freq: Once | INTRAMUSCULAR | Status: AC | PRN
  Administered 2013-11-27: 100 mL via INTRAVENOUS

## 2013-11-29 ENCOUNTER — Telehealth (INDEPENDENT_AMBULATORY_CARE_PROVIDER_SITE_OTHER): Payer: Self-pay | Admitting: *Deleted

## 2013-11-29 DIAGNOSIS — R109 Unspecified abdominal pain: Secondary | ICD-10-CM

## 2013-11-29 DIAGNOSIS — K921 Melena: Secondary | ICD-10-CM

## 2013-11-29 NOTE — Telephone Encounter (Signed)
Per Dr.Rehman the patient will need to have labs drawn in 4 weeks.   

## 2013-12-12 ENCOUNTER — Encounter (INDEPENDENT_AMBULATORY_CARE_PROVIDER_SITE_OTHER): Payer: Self-pay | Admitting: *Deleted

## 2013-12-12 ENCOUNTER — Other Ambulatory Visit (INDEPENDENT_AMBULATORY_CARE_PROVIDER_SITE_OTHER): Payer: Self-pay | Admitting: *Deleted

## 2013-12-12 DIAGNOSIS — R109 Unspecified abdominal pain: Secondary | ICD-10-CM

## 2013-12-12 DIAGNOSIS — K921 Melena: Secondary | ICD-10-CM

## 2014-01-17 ENCOUNTER — Ambulatory Visit (INDEPENDENT_AMBULATORY_CARE_PROVIDER_SITE_OTHER): Payer: 59 | Admitting: Internal Medicine

## 2014-04-11 ENCOUNTER — Emergency Department (HOSPITAL_COMMUNITY)
Admission: EM | Admit: 2014-04-11 | Discharge: 2014-04-11 | Disposition: A | Discharge status: Home / Self Care | Payer: 59 | Attending: Emergency Medicine | Admitting: Emergency Medicine

## 2014-04-11 ENCOUNTER — Encounter (HOSPITAL_COMMUNITY): Payer: Self-pay

## 2014-04-11 DIAGNOSIS — Z7952 Long term (current) use of systemic steroids: Secondary | ICD-10-CM | POA: Diagnosis not present

## 2014-04-11 DIAGNOSIS — R112 Nausea with vomiting, unspecified: Secondary | ICD-10-CM | POA: Insufficient documentation

## 2014-04-11 DIAGNOSIS — Z79899 Other long term (current) drug therapy: Secondary | ICD-10-CM | POA: Insufficient documentation

## 2014-04-11 DIAGNOSIS — Z9049 Acquired absence of other specified parts of digestive tract: Secondary | ICD-10-CM | POA: Insufficient documentation

## 2014-04-11 DIAGNOSIS — Z720 Tobacco use: Secondary | ICD-10-CM | POA: Insufficient documentation

## 2014-04-11 DIAGNOSIS — E079 Disorder of thyroid, unspecified: Secondary | ICD-10-CM | POA: Diagnosis not present

## 2014-04-11 DIAGNOSIS — R51 Headache: Secondary | ICD-10-CM | POA: Diagnosis not present

## 2014-04-11 DIAGNOSIS — R1084 Generalized abdominal pain: Secondary | ICD-10-CM | POA: Diagnosis present

## 2014-04-11 DIAGNOSIS — Z8719 Personal history of other diseases of the digestive system: Secondary | ICD-10-CM | POA: Diagnosis not present

## 2014-04-11 DIAGNOSIS — I10 Essential (primary) hypertension: Secondary | ICD-10-CM | POA: Diagnosis not present

## 2014-04-11 DIAGNOSIS — E860 Dehydration: Secondary | ICD-10-CM | POA: Diagnosis not present

## 2014-04-11 DIAGNOSIS — Z880 Allergy status to penicillin: Secondary | ICD-10-CM | POA: Diagnosis not present

## 2014-04-11 DIAGNOSIS — R079 Chest pain, unspecified: Secondary | ICD-10-CM | POA: Insufficient documentation

## 2014-04-11 DIAGNOSIS — R197 Diarrhea, unspecified: Secondary | ICD-10-CM | POA: Insufficient documentation

## 2014-04-11 DIAGNOSIS — Z9889 Other specified postprocedural states: Secondary | ICD-10-CM | POA: Insufficient documentation

## 2014-04-11 DIAGNOSIS — R1110 Vomiting, unspecified: Secondary | ICD-10-CM

## 2014-04-11 LAB — CBC WITH DIFFERENTIAL/PLATELET
BASOS PCT: 1 % (ref 0–1)
Basophils Absolute: 0.1 10*3/uL (ref 0.0–0.1)
EOS PCT: 1 % (ref 0–5)
Eosinophils Absolute: 0 10*3/uL (ref 0.0–0.7)
HCT: 38.7 % (ref 36.0–46.0)
Hemoglobin: 12.9 g/dL (ref 12.0–15.0)
Lymphocytes Relative: 16 % (ref 12–46)
Lymphs Abs: 1 10*3/uL (ref 0.7–4.0)
MCH: 28.5 pg (ref 26.0–34.0)
MCHC: 33.3 g/dL (ref 30.0–36.0)
MCV: 85.4 fL (ref 78.0–100.0)
MONO ABS: 0.5 10*3/uL (ref 0.1–1.0)
MONOS PCT: 8 % (ref 3–12)
Neutro Abs: 5 10*3/uL (ref 1.7–7.7)
Neutrophils Relative %: 74 % (ref 43–77)
PLATELETS: 245 10*3/uL (ref 150–400)
RBC: 4.53 MIL/uL (ref 3.87–5.11)
RDW: 15.5 % (ref 11.5–15.5)
WBC: 6.6 10*3/uL (ref 4.0–10.5)

## 2014-04-11 LAB — COMPREHENSIVE METABOLIC PANEL
ALT: 29 U/L (ref 0–35)
AST: 25 U/L (ref 0–37)
Albumin: 3.9 g/dL (ref 3.5–5.2)
Alkaline Phosphatase: 57 U/L (ref 39–117)
Anion gap: 11 (ref 5–15)
BUN: 11 mg/dL (ref 6–23)
CALCIUM: 8.7 mg/dL (ref 8.4–10.5)
CO2: 29 mmol/L (ref 19–32)
CREATININE: 0.51 mg/dL (ref 0.50–1.10)
Chloride: 96 mEq/L (ref 96–112)
GLUCOSE: 112 mg/dL — AB (ref 70–99)
POTASSIUM: 3.3 mmol/L — AB (ref 3.5–5.1)
Sodium: 136 mmol/L (ref 135–145)
Total Bilirubin: 1.1 mg/dL (ref 0.3–1.2)
Total Protein: 6.6 g/dL (ref 6.0–8.3)

## 2014-04-11 LAB — URINE MICROSCOPIC-ADD ON

## 2014-04-11 LAB — URINALYSIS, ROUTINE W REFLEX MICROSCOPIC
Glucose, UA: NEGATIVE mg/dL
HGB URINE DIPSTICK: NEGATIVE
Ketones, ur: 15 mg/dL — AB
Leukocytes, UA: NEGATIVE
Nitrite: NEGATIVE
Protein, ur: 100 mg/dL — AB
Specific Gravity, Urine: >1.03 — ABNORMAL HIGH (ref 1.005–1.030)
UROBILINOGEN UA: 1 mg/dL (ref 0.0–1.0)
pH: 6 (ref 5.0–8.0)

## 2014-04-11 LAB — LIPASE, BLOOD: LIPASE: 19 U/L (ref 11–59)

## 2014-04-11 MED ORDER — ONDANSETRON HCL 4 MG/2ML IJ SOLN
4.0000 mg | Freq: Once | INTRAMUSCULAR | Status: AC
  Administered 2014-04-11: 4 mg via INTRAVENOUS
  Filled 2014-04-11: qty 2

## 2014-04-11 MED ORDER — HYDROMORPHONE HCL 1 MG/ML IJ SOLN
1.0000 mg | Freq: Once | INTRAMUSCULAR | Status: AC
  Administered 2014-04-11: 1 mg via INTRAVENOUS
  Filled 2014-04-11: qty 1

## 2014-04-11 MED ORDER — SODIUM CHLORIDE 0.9 % IV BOLUS (SEPSIS)
1000.0000 mL | Freq: Once | INTRAVENOUS | Status: AC
  Administered 2014-04-11: 1000 mL via INTRAVENOUS

## 2014-04-11 MED ORDER — FENTANYL CITRATE 0.05 MG/ML IJ SOLN
100.0000 ug | Freq: Once | INTRAMUSCULAR | Status: AC
  Administered 2014-04-11: 100 ug via INTRAVENOUS
  Filled 2014-04-11: qty 2

## 2014-04-11 MED ORDER — ONDANSETRON HCL 8 MG PO TABS: 8.0000 mg | ORAL_TABLET | Freq: Three times a day (TID) | ORAL | Status: AC | PRN

## 2014-04-11 NOTE — ED Notes (Signed)
MD at bedside. 

## 2014-04-11 NOTE — ED Provider Notes (Signed)
CSN: [id number]     Arrival date & time 04/11/14  7564 History  This chart was scribed for Sharyon Cable, MD by Edison Simon, ED Scribe. This patient was seen in room APA06/APA06 and the patient's care was started at 7:55 AM.    Chief Complaint  Patient presents with  . Abdominal Pain   Patient is a 49 y.o. female presenting with abdominal pain. The history is provided by the patient. No language interpreter was used.  Abdominal Pain Pain location:  Generalized Pain quality: cramping and sharp   Pain radiates to:  Does not radiate Pain severity:  Severe Onset quality:  Sudden Duration:  12 hours Timing:  Intermittent Progression:  Unchanged Chronicity:  New Context: awakening from sleep and eating   Relieved by:  None tried Worsened by:  Nothing tried Ineffective treatments:  None tried Associated symptoms: chest pain, diarrhea, nausea and vomiting   Associated symptoms: no dysuria, no fever and no shortness of breath   Risk factors: no recent hospitalization     HPI Comments: [name] is a 49 y.o. female who presents to the Emergency Department complaining of sharp, intermittent abdominal pain with associated nausea, vomiting, and diarrhea with onset last night. She states she had a dinner of fried potatoes and onions and a baloney sandwich then fell asleep while watching television, then was woken from her sleep by abdominal pain. She currently rates her pain at 8/10. She indicates that the pain affects her abdomen diffusely but is worse and cramping in her lower abdomen. She reports associated headache and chest pain which she suspects is due to vomiting. She denies blood or black material in her emesis. She states she has had 5-6 counts of diarrhea. He reports prior hysterectomy. She denies recent hospitalization or antibiotic use. 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 She denies fever, SOB, dysuria, or back pain.  Past Medical History  Diagnosis Date   . Hypertension   . Thyroid disorder   . Rectal bleed    Past Surgical History  Procedure Laterality Date  . Abdominal hysterectomy  2011  . Colonoscopy    . Upper gastrointestinal endoscopy  2001    [facility]/ Dr.Mann  . Hemorroidectomy  2004  . Thyroidectomy     Family History  Problem Relation Age of Onset  . Hypertension Mother   . Diabetes Mother   . Anxiety disorder Mother   . Heart disease Father   . Kidney disease Father   . Healthy Brother   . Healthy Paternal Grandfather   . Hypertension Brother   . Gout Brother   . Diverticulosis Brother   . Hypertension Brother   . Gout Brother   . Healthy Son    History  Substance Use Topics  . Smoking status: Current Every Day Smoker  . Smokeless tobacco: Never Used  . Alcohol Use: Yes     Comment: Social   OB History    No data available     Review of Systems  Constitutional: Negative for fever.  Respiratory: Negative for shortness of breath.   Cardiovascular: Positive for chest pain.  Gastrointestinal: Positive for nausea, vomiting, abdominal pain and diarrhea.  Genitourinary: Negative for dysuria.  Musculoskeletal: Negative for back pain.  Neurological: Positive for headaches.  All other systems reviewed and are negative.     Allergies  Morphine and related and Penicillins  Home Medications   Prior to Admission medications   Medication Sig Start Date End Date  Taking? Authorizing Provider  ALPRAZolam Duanne Moron) 0.5 MG tablet Take 0.5 mg by mouth 3 (three) times daily as needed for anxiety.    Historical Provider, MD  dicyclomine (BENTYL) 20 MG tablet Take 0.5 tablets (10 mg total) by mouth 3 (three) times daily before meals. 11/20/13   Rogene Houston, MD  diphenhydrAMINE (BENADRYL) 25 MG tablet Take 25 mg by mouth as needed for allergies.    Historical Provider, MD  docusate sodium (COLACE) 100 MG capsule Take 2 capsules (200 mg total) by mouth daily. 11/20/13   Rogene Houston, MD  hydrocortisone (ANUSOL-HC) 25  MG suppository Place 1 suppository (25 mg total) rectally at bedtime. 11/20/13   Rogene Houston, MD  levothyroxine (SYNTHROID, LEVOTHROID) 200 MCG tablet Take 200 mcg by mouth daily before breakfast.    Historical Provider, MD  metoprolol succinate (TOPROL-XL) 25 MG 24 hr tablet Take 25 mg by mouth daily.    Historical Provider, MD   BP 160/100 mmHg  Pulse 99  Temp(Src) 98.5 F (36.9 C) (Oral)  Resp 20  Ht 5\' 5"  (1.651 m)  Wt 172 lb (78.019 kg)  BMI 28.62 kg/m2  SpO2 98% Physical Exam  Nursing note and vitals reviewed.   CONSTITUTIONAL: Well developed/well nourished HEAD: Normocephalic/atraumatic EYES: EOMI/PERRL ENMT: Mucous membranes moist NECK: supple no meningeal signs SPINE/BACK:entire spine nontender CV: S1/S2 noted, no murmurs/rubs/gallops noted LUNGS: Lungs are clear to auscultation bilaterally, no apparent distress ABDOMEN: soft, diffuse moderate tenderness, no rebound or guarding, bowel sounds noted throughout abdomen GU:no cva tenderness NEURO: Pt is awake/alert/appropriate, moves all extremitiesx4.  No facial droop.   EXTREMITIES: pulses normal/equal, full ROM SKIN: warm, color normal, vittiligo PSYCH: no abnormalities of mood noted, alert and oriented to situation  ED Course  Procedures   DIAGNOSTIC STUDIES: Oxygen Saturation is 98% on room air, normal by my interpretation.    COORDINATION OF CARE: 8:03 AM Discussed treatment plan with patient at beside, the patient agrees with the plan and has no further questions at this time.  8:55 AM Patient states she feels somewhat improved at this time. 9:27 AM Pt feels improved She is awake/alert, no distress Abdomen is soft without focal tenderness She is nontoxic in appearance She has responded to IV fluids and pain medications Will try PO challenge and reassess I have low suspicion or acute abdominal pathology at this time  Pt improved She is ambulatory She is taking PO fluids I feel she is safe for d/c  home BP 163/68 mmHg  Pulse 76  Temp(Src) 98.5 F (36.9 C) (Oral)  Resp 18  Ht 5\' 5"  (1.651 m)  Wt 172 lb (78.019 kg)  BMI 28.62 kg/m2  SpO2 99%  Labs Review Labs Reviewed  COMPREHENSIVE METABOLIC PANEL - Abnormal; Notable for the following:    Potassium 3.3 (*)    Glucose, Bld 112 (*)    All other components within normal limits  URINALYSIS, ROUTINE W REFLEX MICROSCOPIC - Abnormal; Notable for the following:    APPearance CLOUDY (*)    Specific Gravity, Urine >1.030 (*)    Bilirubin Urine MODERATE (*)    Ketones, ur 15 (*)    Protein, ur 100 (*)    All other components within normal limits  URINE MICROSCOPIC-ADD ON - Abnormal; Notable for the following:    Squamous Epithelial / LPF MANY (*)    Bacteria, UA FEW (*)    All other components within normal limits  CBC WITH DIFFERENTIAL  LIPASE, BLOOD   EKG Interpretation  Date/Time:  Thursday April 11 2014 08:10:51 EST Ventricular Rate:  93 PR Interval:  126 QRS Duration: 78 QT Interval:  380 QTC Calculation: 473 R Axis:   70 Text Interpretation:  Sinus rhythm Right atrial enlargement Consider left ventricular hypertrophy Baseline wander in lead(s) V6 changes noted from prior Abnormal ekg Confirmed by Christy Gentles  MD, Delanee Xin ([id number]) on 04/11/2014 8:17:45 AM   Medications  sodium chloride 0.9 % bolus 1,000 mL (0 mLs Intravenous Stopped 04/11/14 0915)  ondansetron (ZOFRAN) injection 4 mg (4 mg Intravenous Given 04/11/14 0816)  fentaNYL (SUBLIMAZE) injection 100 mcg (100 mcg Intravenous Given 04/11/14 0816)  HYDROmorphone (DILAUDID) injection 1 mg (1 mg Intravenous Given 04/11/14 0853)  sodium chloride 0.9 % bolus 1,000 mL (0 mLs Intravenous Stopped 04/11/14 1005)    MDM   Final diagnoses:  Vomiting and diarrhea  Dehydration  Generalized abdominal pain    Nursing notes including past medical history and social history reviewed and considered in documentation Labs/vital reviewed myself and considered during  evaluation Previous records reviewed and considered    I personally performed the services described in this documentation, which was scribed in my presence. The recorded information has been reviewed and is accurate.      Sharyon Cable, MD 04/11/14 1556

## 2014-04-11 NOTE — ED Notes (Signed)
Pt. Unable to void

## 2014-04-11 NOTE — ED Notes (Signed)
Pt. Up to restroom. 

## 2014-04-11 NOTE — ED Notes (Signed)
Pt reports epigastric pain, n/v/d after eating dinner last night.

## 2014-04-11 NOTE — ED Notes (Signed)
Water given to pt 

## 2014-04-11 NOTE — Discharge Instructions (Signed)

## 2014-04-11 NOTE — ED Notes (Signed)
Patient given discharge instruction, verbalized understand. IV removed, band aid applied. Patient ambulatory out of the department.  

## 2014-04-21 ENCOUNTER — Encounter (HOSPITAL_COMMUNITY): Payer: Self-pay | Admitting: Emergency Medicine

## 2014-04-21 ENCOUNTER — Emergency Department (HOSPITAL_COMMUNITY)
Admission: EM | Admit: 2014-04-21 | Discharge: 2014-04-21 | Disposition: A | Discharge status: Home / Self Care | Payer: 59 | Attending: Emergency Medicine | Admitting: Emergency Medicine

## 2014-04-21 DIAGNOSIS — E079 Disorder of thyroid, unspecified: Secondary | ICD-10-CM | POA: Insufficient documentation

## 2014-04-21 DIAGNOSIS — Z720 Tobacco use: Secondary | ICD-10-CM | POA: Diagnosis not present

## 2014-04-21 DIAGNOSIS — R251 Tremor, unspecified: Secondary | ICD-10-CM | POA: Diagnosis not present

## 2014-04-21 DIAGNOSIS — Z79899 Other long term (current) drug therapy: Secondary | ICD-10-CM | POA: Insufficient documentation

## 2014-04-21 DIAGNOSIS — Z87448 Personal history of other diseases of urinary system: Secondary | ICD-10-CM | POA: Diagnosis not present

## 2014-04-21 DIAGNOSIS — I10 Essential (primary) hypertension: Secondary | ICD-10-CM | POA: Insufficient documentation

## 2014-04-21 DIAGNOSIS — R109 Unspecified abdominal pain: Secondary | ICD-10-CM | POA: Insufficient documentation

## 2014-04-21 DIAGNOSIS — T443X5A Adverse effect of other parasympatholytics [anticholinergics and antimuscarinics] and spasmolytics, initial encounter: Secondary | ICD-10-CM | POA: Diagnosis not present

## 2014-04-21 DIAGNOSIS — T50905A Adverse effect of unspecified drugs, medicaments and biological substances, initial encounter: Secondary | ICD-10-CM

## 2014-04-21 DIAGNOSIS — Z880 Allergy status to penicillin: Secondary | ICD-10-CM | POA: Insufficient documentation

## 2014-04-21 LAB — CBC WITH DIFFERENTIAL/PLATELET
BASOS ABS: 0.1 10*3/uL (ref 0.0–0.1)
BASOS PCT: 1 % (ref 0–1)
EOS ABS: 0.1 10*3/uL (ref 0.0–0.7)
Eosinophils Relative: 2 % (ref 0–5)
HCT: 37.2 % (ref 36.0–46.0)
Hemoglobin: 12.3 g/dL (ref 12.0–15.0)
LYMPHS ABS: 0.8 10*3/uL (ref 0.7–4.0)
LYMPHS PCT: 14 % (ref 12–46)
MCH: 28.3 pg (ref 26.0–34.0)
MCHC: 33.1 g/dL (ref 30.0–36.0)
MCV: 85.5 fL (ref 78.0–100.0)
Monocytes Absolute: 0.6 10*3/uL (ref 0.1–1.0)
Monocytes Relative: 11 % (ref 3–12)
Neutro Abs: 4 10*3/uL (ref 1.7–7.7)
Neutrophils Relative %: 72 % (ref 43–77)
Platelets: 258 10*3/uL (ref 150–400)
RBC: 4.35 MIL/uL (ref 3.87–5.11)
RDW: 16.1 % — ABNORMAL HIGH (ref 11.5–15.5)
WBC: 5.6 10*3/uL (ref 4.0–10.5)

## 2014-04-21 LAB — BASIC METABOLIC PANEL
ANION GAP: 8 (ref 5–15)
CO2: 27 mmol/L (ref 19–32)
CREATININE: 0.43 mg/dL — AB (ref 0.50–1.10)
Calcium: 9.3 mg/dL (ref 8.4–10.5)
Chloride: 101 mmol/L (ref 96–112)
GFR calc non Af Amer: >90 mL/min (ref >90)
Glucose, Bld: 95 mg/dL (ref 70–99)
Potassium: 3.5 mmol/L (ref 3.5–5.1)
Sodium: 136 mmol/L (ref 135–145)

## 2014-04-21 MED ORDER — ALPRAZOLAM 0.5 MG PO TABS: 0.5000 mg | ORAL_TABLET | Freq: Three times a day (TID) | ORAL | Status: AC | PRN

## 2014-04-21 MED ORDER — SODIUM CHLORIDE 0.9 % IV BOLUS (SEPSIS)
500.0000 mL | Freq: Once | INTRAVENOUS | Status: AC
  Administered 2014-04-21: 500 mL via INTRAVENOUS

## 2014-04-21 MED ORDER — SODIUM CHLORIDE 0.9 % IV SOLN: INTRAVENOUS | Status: DC

## 2014-04-21 MED ORDER — ALPRAZOLAM 0.5 MG PO TABS
0.5000 mg | ORAL_TABLET | Freq: Once | ORAL | Status: AC
  Administered 2014-04-21: 0.5 mg via ORAL
  Filled 2014-04-21: qty 1

## 2014-04-21 MED ORDER — LORAZEPAM 2 MG/ML IJ SOLN
1.0000 mg | Freq: Once | INTRAMUSCULAR | Status: AC
  Administered 2014-04-21: 1 mg via INTRAVENOUS
  Filled 2014-04-21: qty 1

## 2014-04-21 NOTE — ED Notes (Signed)
Placed pt on cardiac monitor

## 2014-04-21 NOTE — ED Notes (Addendum)
Pt reports she stated taking Bentyl on Fri. Pt took 1 dose Fri and a second dose on Sat. Med is new to pt. Pt states she has been having symptoms since Fri but they have become worse.

## 2014-04-21 NOTE — ED Provider Notes (Signed)
CSN: [id number]     Arrival date & time 04/21/14  1643 History   First MD Initiated Contact with Patient 04/21/14 1701     Chief Complaint  Patient presents with  . Allergic Reaction     (Consider location/radiation/quality/duration/timing/severity/associated sxs/prior Treatment) Patient is a 49 y.o. female presenting with allergic reaction. The history is provided by the patient.  Allergic Reaction Presenting symptoms: no difficulty swallowing and no rash    patient started on Bentyl for some abdominal pain that workup was on January 23. Correction 21st of January. Patient the started with tremors on Friday and his persisted and gotten worse to the point where she shaking all over. The symptoms started approximately 30 minutes after the first dose. Patient also was on Xanax long-term in the past and ran out of that about a week ago. Patient still has some mild abdominal pain but nothing severe. No nausea no vomiting no diarrhea. No lip swelling no trouble breathing no throat tightness no hives or rash on the skin.  Past Medical History  Diagnosis Date  . Hypertension   . Thyroid disorder   . Rectal bleed    Past Surgical History  Procedure Laterality Date  . Abdominal hysterectomy  2011  . Colonoscopy    . Upper gastrointestinal endoscopy  2001    [facility]/ Dr.Mann  . Hemorroidectomy  2004  . Thyroidectomy     Family History  Problem Relation Age of Onset  . Hypertension Mother   . Diabetes Mother   . Anxiety disorder Mother   . Heart disease Father   . Kidney disease Father   . Healthy Brother   . Healthy Paternal Grandfather   . Hypertension Brother   . Gout Brother   . Diverticulosis Brother   . Hypertension Brother   . Gout Brother   . Healthy Son    History  Substance Use Topics  . Smoking status: Current Every Day Smoker -- 0.50 packs/day    Types: Cigarettes  . Smokeless tobacco: Never Used  . Alcohol Use: Yes     Comment: Social   OB History    Gravida Para  Term Preterm AB TAB SAB Ectopic Multiple Living   2 2 2             Review of Systems  Constitutional: Negative for fever and chills.  HENT: Negative for congestion and trouble swallowing.   Respiratory: Negative for shortness of breath.   Cardiovascular: Negative for chest pain.  Gastrointestinal: Positive for abdominal pain. Negative for nausea and vomiting.  Genitourinary: Negative for dysuria.  Musculoskeletal: Negative for myalgias.  Skin: Negative for rash.  Neurological: Positive for tremors. Negative for headaches.  Hematological: Does not bruise/bleed easily.  Psychiatric/Behavioral: Negative for confusion.      Allergies  Dicyclomine; Morphine and related; and Penicillins  Home Medications   Prior to Admission medications   Medication Sig Start Date End Date Taking? Authorizing Provider  ALPRAZolam Duanne Moron) 0.5 MG tablet Take 0.5 mg by mouth 3 (three) times daily as needed for anxiety.   Yes Historical Provider, MD  diphenhydrAMINE (BENADRYL) 25 MG tablet Take 25 mg by mouth as needed for allergies.   Yes Historical Provider, MD  docusate sodium (COLACE) 100 MG capsule Take 2 capsules (200 mg total) by mouth daily. 11/20/13  Yes Rogene Houston, MD  levothyroxine (SYNTHROID, LEVOTHROID) 200 MCG tablet Take 200 mcg by mouth daily before breakfast.   Yes Historical Provider, MD  lisinopril (PRINIVIL,ZESTRIL) 10 MG tablet Take  10 mg by mouth daily.   Yes Historical Provider, MD  metoprolol succinate (TOPROL-XL) 25 MG 24 hr tablet Take 25 mg by mouth daily.   Yes Historical Provider, MD  ondansetron (ZOFRAN) 8 MG tablet Take 1 tablet (8 mg total) by mouth every 8 (eight) hours as needed. 04/11/14  Yes Sharyon Cable, MD  ALPRAZolam Duanne Moron) 0.5 MG tablet Take 1 tablet (0.5 mg total) by mouth 3 (three) times daily as needed for anxiety. 04/21/14   Fredia Sorrow, MD   BP 156/100 mmHg  Pulse 85  Temp(Src) 98.3 F (36.8 C) (Oral)  Resp 22  Ht 5' 5.5" (1.664 m)  Wt 173 lb  (78.472 kg)  BMI 28.34 kg/m2  SpO2 97% Physical Exam  Constitutional: She is oriented to person, place, and time. She appears well-developed and well-nourished. No distress.  HENT:  Head: Normocephalic and atraumatic.  Mouth/Throat: Oropharynx is clear and moist.  Eyes: Conjunctivae and EOM are normal. Pupils are equal, round, and reactive to light.  Neck: Normal range of motion.  Cardiovascular: Normal rate, regular rhythm and normal heart sounds.   No murmur heard. Pulmonary/Chest: Effort normal and breath sounds normal. No respiratory distress.  Abdominal: Soft. Bowel sounds are normal. There is no tenderness.  Musculoskeletal: Normal range of motion.  Neurological: She is alert and oriented to person, place, and time. No cranial nerve deficit. She exhibits normal muscle tone. Coordination normal.  Skin: Skin is warm. No rash noted.  Nursing note and vitals reviewed.   ED Course  Procedures (including critical care time) Labs Review Labs Reviewed  CBC WITH DIFFERENTIAL/PLATELET - Abnormal; Notable for the following:    RDW 16.1 (*)    All other components within normal limits  BASIC METABOLIC PANEL - Abnormal; Notable for the following:    BUN <5 (*)    Creatinine, Ser 0.43 (*)    All other components within normal limits    Imaging Review No results found.   EKG Interpretation   Date/Time:  Sunday April 21 2014 16:50:00 EST Ventricular Rate:  84 PR Interval:  132 QRS Duration: 70 QT Interval:  412 QTC Calculation: 486 R Axis:   69 Text Interpretation:  Normal sinus rhythm Nonspecific ST abnormality  Prolonged QT Abnormal ECG Artifact Confirmed by Rogene Houston  MD, Tyasia Packard  ([id number]) on 04/21/2014 5:17:27 PM      MDM   Final diagnoses:  Adverse drug reaction, initial encounter    Patient symptoms probably related to being off Xanax for about a week and the combination of the side effects from Bentyl. Patient improved here with 2 doses of Ativan. Patient still  with some tremors but improving significant weight. Patient will stop taking the Bentyl have renewed his Xanax we'll follow-up with her doctor. Work note provided.  Nothing was consistent with a true allergic reaction there was no hives or was no rash there was no tongue swelling there was no wheezing there was no throat tightness.    Fredia Sorrow, MD 04/21/14 2000

## 2014-04-21 NOTE — ED Notes (Signed)
MD at bedside. 

## 2014-04-21 NOTE — ED Notes (Signed)
Pt placed on new medication on Fri, has had 2 doses. Pt presents with shaking, profuse diaphoresis and SOB.

## 2014-04-21 NOTE — Discharge Instructions (Signed)
Stop taking the Bentyl. Restart the Xanax as needed. Make an appoint with follow-up with your regular doctor. Return for any new or worse symptoms. Work note provided to be off tomorrow.

## 2014-04-21 NOTE — ED Notes (Signed)
Pt started taking dicyclomine on Friday and began having symptoms that she is presenting with. Pt stopped the medication on 1/30 and symptoms began getting worse, bringing her here. She complains on stomach pain, generalized body shaking, and diaphoresis. Pt is tearful upon assessment. Airway is intact and patient upon assessment. MD made aware of patient status by nurse Jenny Reichmann, RN.

## 2014-04-22 ENCOUNTER — Inpatient Hospital Stay (HOSPITAL_COMMUNITY)
Admission: EM | Admit: 2014-04-22 | Discharge: 2014-04-24 | DRG: 897 | Disposition: A | Discharge status: Home / Self Care | Payer: 59 | Attending: Pulmonary Disease | Admitting: Pulmonary Disease

## 2014-04-22 ENCOUNTER — Encounter (HOSPITAL_COMMUNITY): Payer: Self-pay

## 2014-04-22 DIAGNOSIS — Z90710 Acquired absence of both cervix and uterus: Secondary | ICD-10-CM | POA: Diagnosis not present

## 2014-04-22 DIAGNOSIS — F13932 Sedative, hypnotic or anxiolytic use, unspecified with withdrawal with perceptual disturbances: Secondary | ICD-10-CM

## 2014-04-22 DIAGNOSIS — Z23 Encounter for immunization: Secondary | ICD-10-CM | POA: Diagnosis not present

## 2014-04-22 DIAGNOSIS — F10931 Alcohol use, unspecified with withdrawal delirium: Secondary | ICD-10-CM

## 2014-04-22 DIAGNOSIS — F13239 Sedative, hypnotic or anxiolytic dependence with withdrawal, unspecified: Secondary | ICD-10-CM | POA: Diagnosis present

## 2014-04-22 DIAGNOSIS — Z833 Family history of diabetes mellitus: Secondary | ICD-10-CM | POA: Diagnosis not present

## 2014-04-22 DIAGNOSIS — I10 Essential (primary) hypertension: Secondary | ICD-10-CM | POA: Diagnosis present

## 2014-04-22 DIAGNOSIS — Z720 Tobacco use: Secondary | ICD-10-CM | POA: Insufficient documentation

## 2014-04-22 DIAGNOSIS — F13232 Sedative, hypnotic or anxiolytic dependence with withdrawal with perceptual disturbance: Secondary | ICD-10-CM

## 2014-04-22 DIAGNOSIS — F1010 Alcohol abuse, uncomplicated: Secondary | ICD-10-CM | POA: Insufficient documentation

## 2014-04-22 DIAGNOSIS — F17210 Nicotine dependence, cigarettes, uncomplicated: Secondary | ICD-10-CM | POA: Diagnosis present

## 2014-04-22 DIAGNOSIS — F10239 Alcohol dependence with withdrawal, unspecified: Secondary | ICD-10-CM | POA: Diagnosis present

## 2014-04-22 DIAGNOSIS — E039 Hypothyroidism, unspecified: Secondary | ICD-10-CM | POA: Diagnosis present

## 2014-04-22 DIAGNOSIS — F10232 Alcohol dependence with withdrawal with perceptual disturbance: Secondary | ICD-10-CM

## 2014-04-22 DIAGNOSIS — F10231 Alcohol dependence with withdrawal delirium: Principal | ICD-10-CM | POA: Diagnosis present

## 2014-04-22 DIAGNOSIS — F419 Anxiety disorder, unspecified: Secondary | ICD-10-CM | POA: Diagnosis present

## 2014-04-22 DIAGNOSIS — R000 Tachycardia, unspecified: Secondary | ICD-10-CM | POA: Diagnosis present

## 2014-04-22 DIAGNOSIS — E876 Hypokalemia: Secondary | ICD-10-CM | POA: Diagnosis present

## 2014-04-22 DIAGNOSIS — F10939 Alcohol use, unspecified with withdrawal, unspecified: Secondary | ICD-10-CM | POA: Diagnosis present

## 2014-04-22 DIAGNOSIS — F13939 Sedative, hypnotic or anxiolytic use, unspecified with withdrawal, unspecified: Secondary | ICD-10-CM | POA: Insufficient documentation

## 2014-04-22 DIAGNOSIS — Z818 Family history of other mental and behavioral disorders: Secondary | ICD-10-CM | POA: Diagnosis not present

## 2014-04-22 DIAGNOSIS — Z8249 Family history of ischemic heart disease and other diseases of the circulatory system: Secondary | ICD-10-CM | POA: Diagnosis not present

## 2014-04-22 DIAGNOSIS — E860 Dehydration: Secondary | ICD-10-CM | POA: Diagnosis present

## 2014-04-22 DIAGNOSIS — F10932 Alcohol use, unspecified with withdrawal with perceptual disturbance: Secondary | ICD-10-CM

## 2014-04-22 HISTORY — DX: Alcohol abuse, uncomplicated: F10.10

## 2014-04-22 HISTORY — DX: Tobacco use: Z72.0

## 2014-04-22 LAB — ETHANOL: ALCOHOL ETHYL (B): 5 mg/dL (ref 0–9)

## 2014-04-22 LAB — CBC
HCT: 35.3 % — ABNORMAL LOW (ref 36.0–46.0)
HEMOGLOBIN: 11.5 g/dL — AB (ref 12.0–15.0)
MCH: 27.8 pg (ref 26.0–34.0)
MCHC: 32.6 g/dL (ref 30.0–36.0)
MCV: 85.5 fL (ref 78.0–100.0)
Platelets: 228 10*3/uL (ref 150–400)
RBC: 4.13 MIL/uL (ref 3.87–5.11)
RDW: 16 % — AB (ref 11.5–15.5)
WBC: 5.5 10*3/uL (ref 4.0–10.5)

## 2014-04-22 LAB — MRSA PCR SCREENING: MRSA by PCR: NEGATIVE

## 2014-04-22 LAB — TSH: TSH: 0.051 u[IU]/mL — ABNORMAL LOW (ref 0.350–4.500)

## 2014-04-22 LAB — COMPREHENSIVE METABOLIC PANEL
ALT: 57 U/L — ABNORMAL HIGH (ref 0–35)
AST: 37 U/L (ref 0–37)
Albumin: 3.9 g/dL (ref 3.5–5.2)
Alkaline Phosphatase: 109 U/L (ref 39–117)
Anion gap: 12 (ref 5–15)
BUN: 8 mg/dL (ref 6–23)
CO2: 26 mmol/L (ref 19–32)
CREATININE: 0.48 mg/dL — AB (ref 0.50–1.10)
Calcium: 9.3 mg/dL (ref 8.4–10.5)
Chloride: 99 mmol/L (ref 96–112)
GFR calc non Af Amer: >90 mL/min (ref >90)
Glucose, Bld: 100 mg/dL — ABNORMAL HIGH (ref 70–99)
Potassium: 3.1 mmol/L — ABNORMAL LOW (ref 3.5–5.1)
Sodium: 137 mmol/L (ref 135–145)
Total Bilirubin: 1.3 mg/dL — ABNORMAL HIGH (ref 0.3–1.2)
Total Protein: 7.4 g/dL (ref 6.0–8.3)

## 2014-04-22 LAB — CK: CK TOTAL: 113 U/L (ref 7–177)

## 2014-04-22 LAB — GLUCOSE, CAPILLARY: Glucose-Capillary: 150 mg/dL — ABNORMAL HIGH (ref 70–99)

## 2014-04-22 MED ORDER — VITAMIN B-1 100 MG PO TABS
100.0000 mg | ORAL_TABLET | Freq: Every day | ORAL | Status: DC
  Administered 2014-04-23 – 2014-04-24 (×2): 100 mg via ORAL
  Filled 2014-04-22 (×2): qty 1

## 2014-04-22 MED ORDER — LORAZEPAM 2 MG/ML IJ SOLN
2.0000 mg | INTRAMUSCULAR | Status: DC
  Administered 2014-04-22 (×2): 2 mg via INTRAVENOUS
  Filled 2014-04-22 (×2): qty 1

## 2014-04-22 MED ORDER — DIPHENHYDRAMINE HCL 25 MG PO CAPS: 25.0000 mg | ORAL_CAPSULE | Freq: Three times a day (TID) | ORAL | Status: DC | PRN

## 2014-04-22 MED ORDER — METOPROLOL TARTRATE 1 MG/ML IV SOLN
5.0000 mg | Freq: Four times a day (QID) | INTRAVENOUS | Status: DC
  Administered 2014-04-22 – 2014-04-24 (×9): 5 mg via INTRAVENOUS
  Filled 2014-04-22 (×9): qty 5

## 2014-04-22 MED ORDER — HYDRALAZINE HCL 20 MG/ML IJ SOLN
5.0000 mg | INTRAMUSCULAR | Status: DC | PRN
  Administered 2014-04-23 (×2): 5 mg via INTRAVENOUS
  Filled 2014-04-22 (×2): qty 1

## 2014-04-22 MED ORDER — ONDANSETRON HCL 4 MG PO TABS: 4.0000 mg | ORAL_TABLET | Freq: Four times a day (QID) | ORAL | Status: DC | PRN

## 2014-04-22 MED ORDER — MORPHINE SULFATE 2 MG/ML IJ SOLN
1.0000 mg | INTRAMUSCULAR | Status: DC | PRN
  Administered 2014-04-22: 1 mg via INTRAVENOUS
  Filled 2014-04-22: qty 1

## 2014-04-22 MED ORDER — FOLIC ACID 1 MG PO TABS
1.0000 mg | ORAL_TABLET | Freq: Every day | ORAL | Status: DC
  Administered 2014-04-23 – 2014-04-24 (×2): 1 mg via ORAL
  Filled 2014-04-22 (×2): qty 1

## 2014-04-22 MED ORDER — DOCUSATE SODIUM 100 MG PO CAPS
200.0000 mg | ORAL_CAPSULE | Freq: Every day | ORAL | Status: DC
  Administered 2014-04-22 – 2014-04-24 (×3): 200 mg via ORAL
  Filled 2014-04-22 (×3): qty 2

## 2014-04-22 MED ORDER — ENOXAPARIN SODIUM 40 MG/0.4ML ~~LOC~~ SOLN
40.0000 mg | SUBCUTANEOUS | Status: DC
  Administered 2014-04-22 – 2014-04-24 (×3): 40 mg via SUBCUTANEOUS
  Filled 2014-04-22 (×3): qty 0.4

## 2014-04-22 MED ORDER — NICOTINE 21 MG/24HR TD PT24
21.0000 mg | MEDICATED_PATCH | Freq: Every day | TRANSDERMAL | Status: DC
  Administered 2014-04-22 – 2014-04-24 (×3): 21 mg via TRANSDERMAL
  Filled 2014-04-22 (×3): qty 1

## 2014-04-22 MED ORDER — ALUM & MAG HYDROXIDE-SIMETH 200-200-20 MG/5ML PO SUSP: 30.0000 mL | Freq: Four times a day (QID) | ORAL | Status: DC | PRN

## 2014-04-22 MED ORDER — SODIUM CHLORIDE 0.9 % IJ SOLN
3.0000 mL | Freq: Two times a day (BID) | INTRAMUSCULAR | Status: DC
  Administered 2014-04-22 – 2014-04-24 (×4): 3 mL via INTRAVENOUS

## 2014-04-22 MED ORDER — CLONIDINE HCL 0.1 MG PO TABS: 0.1000 mg | ORAL_TABLET | Freq: Two times a day (BID) | ORAL | Status: DC

## 2014-04-22 MED ORDER — LORAZEPAM 2 MG/ML IJ SOLN
2.0000 mg | Freq: Once | INTRAMUSCULAR | Status: AC
  Administered 2014-04-22: 2 mg via INTRAVENOUS
  Filled 2014-04-22: qty 1

## 2014-04-22 MED ORDER — LEVOTHYROXINE SODIUM 100 MCG PO TABS
200.0000 ug | ORAL_TABLET | Freq: Every day | ORAL | Status: DC
  Administered 2014-04-23 – 2014-04-24 (×2): 200 ug via ORAL
  Filled 2014-04-22 (×2): qty 2

## 2014-04-22 MED ORDER — SODIUM CHLORIDE 0.9 % IV SOLN
INTRAVENOUS | Status: DC
  Administered 2014-04-22 – 2014-04-24 (×6): via INTRAVENOUS

## 2014-04-22 MED ORDER — LORAZEPAM 2 MG/ML IJ SOLN
2.0000 mg | Freq: Once | INTRAMUSCULAR | Status: AC
  Administered 2014-04-22: 2 mg via INTRAVENOUS

## 2014-04-22 MED ORDER — LORAZEPAM 2 MG/ML IJ SOLN
0.0000 mg | Freq: Two times a day (BID) | INTRAMUSCULAR | Status: DC
Start: 2014-04-24 — End: 2014-04-22

## 2014-04-22 MED ORDER — THIAMINE HCL 100 MG/ML IJ SOLN: 100.0000 mg | Freq: Every day | INTRAMUSCULAR | Status: DC

## 2014-04-22 MED ORDER — CLONIDINE HCL 0.1 MG/24HR TD PTWK
0.1000 mg | MEDICATED_PATCH | TRANSDERMAL | Status: DC
  Administered 2014-04-22: 0.1 mg via TRANSDERMAL
  Filled 2014-04-22: qty 1

## 2014-04-22 MED ORDER — LORAZEPAM 2 MG/ML IJ SOLN
2.0000 mg | INTRAMUSCULAR | Status: AC
  Administered 2014-04-22: 2 mg via INTRAVENOUS
  Filled 2014-04-22: qty 1

## 2014-04-22 MED ORDER — LORAZEPAM 0.5 MG PO TABS
1.0000 mg | ORAL_TABLET | Freq: Four times a day (QID) | ORAL | Status: DC | PRN
Start: 2014-04-22 — End: 2014-04-22

## 2014-04-22 MED ORDER — ACETAMINOPHEN 650 MG RE SUPP: 650.0000 mg | Freq: Four times a day (QID) | RECTAL | Status: DC | PRN

## 2014-04-22 MED ORDER — LORAZEPAM 0.5 MG PO TABS: 1.0000 mg | ORAL_TABLET | Freq: Four times a day (QID) | ORAL | Status: DC | PRN

## 2014-04-22 MED ORDER — MIDAZOLAM HCL 2 MG/2ML IJ SOLN
1.0000 mg | INTRAMUSCULAR | Status: DC | PRN
  Administered 2014-04-22 (×3): 2 mg via INTRAVENOUS
  Filled 2014-04-22 (×3): qty 2

## 2014-04-22 MED ORDER — LORAZEPAM 2 MG/ML IJ SOLN
1.0000 mg | Freq: Four times a day (QID) | INTRAMUSCULAR | Status: DC | PRN
  Filled 2014-04-22: qty 1

## 2014-04-22 MED ORDER — LORAZEPAM 2 MG/ML IJ SOLN
2.0000 mg | Freq: Four times a day (QID) | INTRAMUSCULAR | Status: DC
  Administered 2014-04-22 – 2014-04-24 (×8): 2 mg via INTRAVENOUS
  Filled 2014-04-22 (×8): qty 1

## 2014-04-22 MED ORDER — VITAMIN B-1 100 MG PO TABS
100.0000 mg | ORAL_TABLET | Freq: Every day | ORAL | Status: DC
  Administered 2014-04-22: 100 mg via ORAL
  Filled 2014-04-22: qty 1

## 2014-04-22 MED ORDER — ADULT MULTIVITAMIN W/MINERALS CH
1.0000 | ORAL_TABLET | Freq: Every day | ORAL | Status: DC
  Administered 2014-04-23 – 2014-04-24 (×2): 1 via ORAL
  Filled 2014-04-22 (×2): qty 1

## 2014-04-22 MED ORDER — ONDANSETRON HCL 4 MG/2ML IJ SOLN: 4.0000 mg | Freq: Four times a day (QID) | INTRAMUSCULAR | Status: DC | PRN

## 2014-04-22 MED ORDER — POTASSIUM CHLORIDE CRYS ER 20 MEQ PO TBCR
40.0000 meq | EXTENDED_RELEASE_TABLET | Freq: Once | ORAL | Status: AC
  Administered 2014-04-22: 40 meq via ORAL
  Filled 2014-04-22: qty 2

## 2014-04-22 MED ORDER — LORAZEPAM 2 MG/ML IJ SOLN
0.0000 mg | Freq: Four times a day (QID) | INTRAMUSCULAR | Status: DC
Start: 2014-04-22 — End: 2014-04-22

## 2014-04-22 MED ORDER — LORAZEPAM 2 MG/ML IJ SOLN: 1.0000 mg | Freq: Four times a day (QID) | INTRAMUSCULAR | Status: DC | PRN

## 2014-04-22 MED ORDER — PNEUMOCOCCAL VAC POLYVALENT 25 MCG/0.5ML IJ INJ
0.5000 mL | INJECTION | INTRAMUSCULAR | Status: DC
  Filled 2014-04-22: qty 0.5

## 2014-04-22 MED ORDER — LISINOPRIL 10 MG PO TABS
10.0000 mg | ORAL_TABLET | Freq: Every day | ORAL | Status: DC
  Administered 2014-04-23 – 2014-04-24 (×2): 10 mg via ORAL
  Filled 2014-04-22 (×2): qty 1

## 2014-04-22 MED ORDER — ADULT MULTIVITAMIN W/MINERALS CH
1.0000 | ORAL_TABLET | Freq: Every day | ORAL | Status: DC
  Administered 2014-04-22: 1 via ORAL
  Filled 2014-04-22: qty 1

## 2014-04-22 MED ORDER — DIPHENHYDRAMINE HCL 50 MG/ML IJ SOLN
25.0000 mg | Freq: Four times a day (QID) | INTRAMUSCULAR | Status: DC | PRN
  Administered 2014-04-22: 25 mg via INTRAVENOUS
  Filled 2014-04-22: qty 1

## 2014-04-22 MED ORDER — ACETAMINOPHEN 325 MG PO TABS
650.0000 mg | ORAL_TABLET | Freq: Four times a day (QID) | ORAL | Status: DC | PRN
  Administered 2014-04-24: 650 mg via ORAL
  Filled 2014-04-22: qty 2

## 2014-04-22 MED ORDER — DEXMEDETOMIDINE HCL IN NACL 200 MCG/50ML IV SOLN
0.2000 ug/kg/h | INTRAVENOUS | Status: AC
  Administered 2014-04-22: 0.2 ug/kg/h via INTRAVENOUS
  Administered 2014-04-22: 0.8 ug/kg/h via INTRAVENOUS
  Administered 2014-04-23: 1 ug/kg/h via INTRAVENOUS
  Administered 2014-04-23: 0.678 ug/kg/h via INTRAVENOUS
  Administered 2014-04-23: 0.7 ug/kg/h via INTRAVENOUS
  Administered 2014-04-23: 1.4 ug/kg/h via INTRAVENOUS
  Administered 2014-04-23: 1.3 ug/kg/h via INTRAVENOUS
  Administered 2014-04-23: 1.2 ug/kg/h via INTRAVENOUS
  Filled 2014-04-22 (×5): qty 50
  Filled 2014-04-22: qty 100
  Filled 2014-04-22: qty 50

## 2014-04-22 MED ORDER — MAGNESIUM SULFATE 2 GM/50ML IV SOLN
2.0000 g | Freq: Once | INTRAVENOUS | Status: AC
  Administered 2014-04-22: 2 g via INTRAVENOUS
  Filled 2014-04-22: qty 50

## 2014-04-22 MED ORDER — LORAZEPAM 1 MG PO TABS
1.0000 mg | ORAL_TABLET | Freq: Once | ORAL | Status: AC
  Administered 2014-04-22: 1 mg via ORAL
  Filled 2014-04-22 (×2): qty 1

## 2014-04-22 MED ORDER — HALOPERIDOL LACTATE 5 MG/ML IJ SOLN
1.0000 mg | INTRAMUSCULAR | Status: AC
  Administered 2014-04-22: 1 mg via INTRAVENOUS
  Filled 2014-04-22: qty 1

## 2014-04-22 MED ORDER — FOLIC ACID 1 MG PO TABS
1.0000 mg | ORAL_TABLET | Freq: Every day | ORAL | Status: DC
  Administered 2014-04-22: 1 mg via ORAL
  Filled 2014-04-22: qty 1

## 2014-04-22 MED ORDER — THIAMINE HCL 100 MG/ML IJ SOLN
100.0000 mg | Freq: Every day | INTRAMUSCULAR | Status: DC
  Filled 2014-04-22: qty 2

## 2014-04-22 MED ORDER — METOPROLOL SUCCINATE ER 25 MG PO TB24
25.0000 mg | ORAL_TABLET | Freq: Every day | ORAL | Status: DC
  Administered 2014-04-22: 25 mg via ORAL
  Filled 2014-04-22: qty 1

## 2014-04-22 NOTE — ED Notes (Signed)
MD at bedside. 

## 2014-04-22 NOTE — H&P (Signed)
[facility] History and Physical  ANIVEA VELASQUES ZHY:[id number] DOB: [birth year] DOA: 04/22/2014  Referring physician:  PCP: Alonza Bogus, MD   Chief Complaint: tremors  HPI: [name] is a very pleasant 49 y.o. female the past medical history that includes hypertension, thyroid disorder, EtOH abuse, tobacco abuse presents to the emergency Department chief complaint tremors. Initial evaluation in the emergency department reveals tachycardia, hypokalemia, significant tremor only slightly improved with Ativan and reported auditory hallucinations. Patient reports she stopped taking her Xanax 4 days ago. Prior to that she was taking 0.5 mg 3 times a day. In addition she has been gradually weaning herself off of alcohol. Last 5 days she has consumed only one shot the equivalent of 2 ounces of liquor. Prior to that for 3 weeks she consumed 3 "one dollar a bottle" of liquor a week and prior to that she consumed 1"3 dollar a bottle" per day. Since symptoms include worsening tremors, abdominal pain, nausea without vomiting, onset patient and auditory hallucinations. Workup in the emergency department includes complete metabolic panel significant for potassium level 3.1, AST 57 total bili 1.3, complete blood count with a hemoglobin of 11.5. In the emergency department she is afebrile and hemodynamically stable but hypertensive mild tachycardia she is not hypoxic.   Review of Systems:  10 point review of systems completed and all systems are negative except as indicated in the history of present illness  Past Medical History  Diagnosis Date  . Hypertension   . Thyroid disorder   . Rectal bleed   . ETOH abuse   . Tobacco abuse    Past Surgical History  Procedure Laterality Date  . Abdominal hysterectomy  2011  . Colonoscopy    . Upper gastrointestinal endoscopy  2001    [facility]/ Dr.Mann  . Hemorroidectomy  2004  . Thyroidectomy     Social History:  reports that she has  been smoking Cigarettes.  She has been smoking about 0.50 packs per day. She has never used smokeless tobacco. She reports that she drinks alcohol. She reports that she does not use illicit drugs. 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 Dicyclomine     Cold sweats, shaking, hallucinations. (Resulted in ER visit)  . Morphine And Related Itching  . Penicillins Other (See Comments)    Patient states that it burned while being injected.    Family History  Problem Relation Age of Onset  . Hypertension Mother   . Diabetes Mother   . Anxiety disorder Mother   . Heart disease Father   . Kidney disease Father   . Healthy Brother   . Healthy Paternal Grandfather   . Hypertension Brother   . Gout Brother   . Diverticulosis Brother   . Hypertension Brother   . Gout Brother   . Healthy Son      Prior to Admission medications   Medication Sig Start Date End Date Taking? Authorizing Provider  ALPRAZolam Duanne Moron) 0.5 MG tablet Take 1 tablet (0.5 mg total) by mouth 3 (three) times daily as needed for anxiety. 04/21/14  Yes Fredia Sorrow, MD  diphenhydrAMINE (BENADRYL) 25 MG tablet Take 50 mg by mouth as needed for allergies.    Yes Historical Provider, MD  docusate sodium (COLACE) 100 MG capsule Take 2 capsules (200 mg total) by mouth daily. 11/20/13  Yes Rogene Houston, MD  ibuprofen (ADVIL,MOTRIN) 200 MG tablet Take 600 mg by mouth every 6 (six) hours as needed for moderate pain.   Yes Historical Provider, MD  levothyroxine (SYNTHROID, LEVOTHROID) 200 MCG tablet Take 200 mcg by mouth daily before breakfast.   Yes Historical Provider, MD  lisinopril (PRINIVIL,ZESTRIL) 10 MG tablet Take 10 mg by mouth daily.   Yes Historical Provider, MD  metoprolol succinate (TOPROL-XL) 25 MG 24 hr tablet Take 25 mg by mouth daily.   Yes Historical Provider, MD  ondansetron (ZOFRAN) 8 MG  tablet Take 1 tablet (8 mg total) by mouth every 8 (eight) hours as needed. 04/11/14  Yes Sharyon Cable, MD   Physical Exam: Filed Vitals:   04/22/14 0900 04/22/14 0930 04/22/14 1045 04/22/14 1100  BP: 170/96 165/119 167/100 158/145  Pulse: 99 100 98 95  Temp:      TempSrc:      Resp:   21 16  SpO2: 98% 98% 97% 98%    Wt Readings from Last 3 Encounters:  04/21/14 78.472 kg (173 lb)  04/11/14 78.019 kg (172 lb)  11/20/13 73.573 kg (162 lb 3.2 oz)    General:  Appears moderately anxious but not uncomfortable Eyes: PERRL, normal lids, irises & conjunctiva ENT: grossly normal hearing, lips & tongue, mucous membranes of her mouth are moist and pink Neck: no LAD, masses or thyromegaly Cardiovascular: Tachycardic but regular +murmur. No LE edema. Respiratory: CTA bilaterally, no w/r/r. Normal respiratory effort. Abdomen: soft, ntnd  positive bowel sounds but somewhat sluggish no guarding no rebound Skin: no rash or induration seen on limited exam Musculoskeletal: grossly normal tone BUE/BLE Psychiatric: grossly normal mood and affect, speech fluent and appropriate Neurologic: grossly non-focal. Speech is clear she is alert and oriented cooperative           Labs on Admission:  Basic Metabolic Panel:  Recent Labs Lab 04/21/14 1730 04/22/14 0830  NA 136 137  K 3.5 3.1*  CL 101 99  CO2 27 26  GLUCOSE 95 100*  BUN <5* 8  CREATININE 0.43* 0.48*  CALCIUM 9.3 9.3   Liver Function Tests:  Recent Labs Lab 04/22/14 0830  AST 37  ALT 57*  ALKPHOS 109  BILITOT 1.3*  PROT 7.4  ALBUMIN 3.9   No results for input(s): LIPASE, AMYLASE in the last 168 hours. No results for input(s): AMMONIA in the last 168 hours. CBC:  Recent Labs Lab 04/21/14 1700 04/22/14 0830  WBC 5.6 5.5  NEUTROABS 4.0  --   HGB 12.3 11.5*  HCT 37.2 35.3*  MCV 85.5 85.5  PLT 258 228   Cardiac Enzymes:  Recent Labs Lab 04/22/14 0830  CKTOTAL 113    BNP (last 3 results) No results for  input(s): PROBNP in the last 8760 hours. CBG: No results for input(s): GLUCAP in the last 168 hours.  Radiological Exams on Admission: No results found.  EKG: Tachycardia with atrial enlargement and LVH  Assessment/Plan Principal Problem:   Alcohol withdrawal: With some early signs of delirium. Will admit to telemetry. Will initiate CIWA protocol. Provide other supportive therapy in the form of IV fluids pain medicine and nausea medicine as needed. Active Problems:   Essential hypertension, benign: On the higher side of normal. I medications include lisinopril and metoprolol. Continue these and monitor. Will provide hydralazine when necessary as needed.    Hypothyroidism: Will check TSH continue home meds    Hypokalemia: Mild. Repleted in the emergency department. Will recheck in the a.m.    Tachycardia:  Likely related to #1 in the setting of mild dehydration. Will provide vigorous IV fluids. Resume home beta blocker Will also check a TSH. Monitor on telemetry. Currently she has no chest pain or shortness of breath or hypoxia.    Code Status: full DVT Prophylaxis: Family Communication: none present Disposition Plan: home when ready  Time spent: 18 minutes  [facility] Pager [phone]

## 2014-04-22 NOTE — Progress Notes (Signed)
eLink Physician-Brief Progress Note Patient Name: [name] DOB: Jun 13, 1965 MRN: [id number]   Date of Service  04/22/2014  HPI/Events of Note  DT worsening on max precedex  eICU Interventions  Prn versed, consider scheduled ativan But I am leaning toward need ETT and benzo drip Will have [facility] doc assess at bedside     Intervention Category Major Interventions: Change in mental status - evaluation and management  Jeffrie Lofstrom J. 04/22/2014, 10:59 PM

## 2014-04-22 NOTE — ED Notes (Signed)
Pt. Asked to go outside to smoke, pt. Advised she is not allowed to do that.

## 2014-04-22 NOTE — ED Notes (Signed)
Notified Dr. Luan Pulling office that pt. Was here, per pt's request.

## 2014-04-22 NOTE — ED Provider Notes (Signed)
CSN: [id number]     Arrival date & time 04/22/14  0745 History  This chart was scribed for [name], MD by Stephania Fragmin, ED Scribe. This patient was seen in room APA06/AP[facility]06 and the patient's care was started at 7:52 AM.    Chief Complaint  Patient presents with  . Tremors   The history is provided by the patient. No language interpreter was used.     HPI Comments: [name] is a 49 y.o. female who presents to the Emergency Department complaining of unchanged, intermittent, gradual onset generalized tremors that began 2 weeks ago on Friday after she took her first dose of dicyclomine for the first time for IBS. She was seen here yesterday for the same. She complains of associated difficulty speaking, generalized myalgia, and bilateral hand tightness, all secondary to trembling. She was unable to ambulate yesterday due to the pain and shakiness. She also notes dry heaves yesterday, likely due to IBS. Her Gastroenterologist Dr. Matthew Saras instructed her not to take the medication anymore. She states she was on Xanax long term which she last took over 1 week ago when she ran out. She denies fever, chills, urinary symptoms, or any bowel symptoms associated with the tremors.   Later in the course of the patient's illness she now endorses that she has been drinking heavily for approximately 4 years. She states that she started to taper off of this alcohol recently and has gone cold [place].  She reports that over the last several days she has had intermittent episodes of delirium and hallucinations where she felt like she was talking on the phone to a family member was really talking into her hand, she feels as though there are people in the room, she occasionally talks to them. She notes that when she takes alcohol it improves her symptoms.  Past Medical History  Diagnosis Date  . Hypertension   . Thyroid disorder   . Rectal bleed    Past Surgical History  Procedure Laterality Date  .  Abdominal hysterectomy  2011  . Colonoscopy    . Upper gastrointestinal endoscopy  2001    [facility]/ Dr.Mann  . Hemorroidectomy  2004  . Thyroidectomy     Family History  Problem Relation Age of Onset  . Hypertension Mother   . Diabetes Mother   . Anxiety disorder Mother   . Heart disease Father   . Kidney disease Father   . Healthy Brother   . Healthy Paternal Grandfather   . Hypertension Brother   . Gout Brother   . Diverticulosis Brother   . Hypertension Brother   . Gout Brother   . Healthy Son    History  Substance Use Topics  . Smoking status: Current Every Day Smoker -- 0.50 packs/day    Types: Cigarettes  . Smokeless tobacco: Never Used  . Alcohol Use: Yes     Comment: Social   OB History    Gravida Para Term Preterm AB TAB SAB Ectopic Multiple Living   2 2 2             Review of Systems  Constitutional: Negative for fever and chills.  Gastrointestinal: Negative for diarrhea, constipation, blood in stool and anal bleeding.  Genitourinary: Negative for dysuria, urgency, frequency, hematuria, decreased urine volume, enuresis and difficulty urinating.  Musculoskeletal: Positive for myalgias.  Neurological: Positive for tremors and speech difficulty (secondary to tremors).  All other systems reviewed and are negative.     Allergies  Dicyclomine;  Morphine and related; and Penicillins  Home Medications   Prior to Admission medications   Medication Sig Start Date End Date Taking? Authorizing Provider  ALPRAZolam Duanne Moron) 0.5 MG tablet Take 1 tablet (0.5 mg total) by mouth 3 (three) times daily as needed for anxiety. 04/21/14  Yes Fredia Sorrow, MD  diphenhydrAMINE (BENADRYL) 25 MG tablet Take 50 mg by mouth as needed for allergies.    Yes Historical Provider, MD  docusate sodium (COLACE) 100 MG capsule Take 2 capsules (200 mg total) by mouth daily. 11/20/13  Yes Rogene Houston, MD  ibuprofen (ADVIL,MOTRIN) 200 MG tablet Take 600 mg by mouth every 6 (six) hours as  needed for moderate pain.   Yes Historical Provider, MD  levothyroxine (SYNTHROID, LEVOTHROID) 200 MCG tablet Take 200 mcg by mouth daily before breakfast.   Yes Historical Provider, MD  lisinopril (PRINIVIL,ZESTRIL) 10 MG tablet Take 10 mg by mouth daily.   Yes Historical Provider, MD  metoprolol succinate (TOPROL-XL) 25 MG 24 hr tablet Take 25 mg by mouth daily.   Yes Historical Provider, MD  ondansetron (ZOFRAN) 8 MG tablet Take 1 tablet (8 mg total) by mouth every 8 (eight) hours as needed. 04/11/14  Yes Sharyon Cable, MD   BP 167/100 mmHg  Pulse 98  Temp(Src) 98.5 F (36.9 C) (Oral)  Resp 21  SpO2 97% Physical Exam  Constitutional: She appears well-developed and well-nourished. No distress.  HENT:  Head: Normocephalic and atraumatic.  Mouth/Throat: Oropharynx is clear and moist. No oropharyngeal exudate.  Eyes: Conjunctivae and EOM are normal. Pupils are equal, round, and reactive to light. Right eye exhibits no discharge. Left eye exhibits no discharge. No scleral icterus.  Neck: Normal range of motion. Neck supple. No JVD present. No thyromegaly present.  Cardiovascular: Regular rhythm, normal heart sounds and intact distal pulses.  Tachycardia present.  Exam reveals no gallop and no friction rub.   No murmur heard. Tachycardic.  Pulmonary/Chest: Effort normal and breath sounds normal. No respiratory distress. She has no wheezes. She has no rales.  Abdominal: Soft. Bowel sounds are normal. She exhibits no distension and no mass. There is no tenderness.  Musculoskeletal: Normal range of motion. She exhibits no edema or tenderness.  Lymphadenopathy:    She has no cervical adenopathy.  Neurological: She is alert. Coordination normal.  Tremors.  Skin: Skin is warm and dry. No rash noted. No erythema.  Psychiatric: She has a normal mood and affect. Her behavior is normal.  Nursing note and vitals reviewed.   ED Course  Procedures (including critical care time)  DIAGNOSTIC  STUDIES: Oxygen Saturation is 97% on room air, normal by my interpretation.    COORDINATION OF CARE: 7:58 AM - Discussed treatment plan with pt at bedside which includes blood tests for potassium levels and CK levels, and Ativan, and pt agreed to plan.   Labs Review Labs Reviewed  COMPREHENSIVE METABOLIC PANEL - Abnormal; Notable for the following:    Potassium 3.1 (*)    Glucose, Bld 100 (*)    Creatinine, Ser 0.48 (*)    ALT 57 (*)    Total Bilirubin 1.3 (*)    All other components within normal limits  CBC - Abnormal; Notable for the following:    Hemoglobin 11.5 (*)    HCT 35.3 (*)    RDW 16.0 (*)    All other components within normal limits  CK  ETHANOL    Imaging Review No results found.   EKG Interpretation  Date/Time:  Monday April 22 2014 10:43:31 EST Ventricular Rate:  104 PR Interval:  126 QRS Duration: 75 QT Interval:  374 QTC Calculation: 492 R Axis:   64 Text Interpretation:  Sinus tachycardia Right atrial enlargement Left  ventricular hypertrophy Borderline prolonged QT interval Abnormal ekg  since last tracing no significant change Confirmed by Zulma Court  MD, Wilder Amodei  ([id number]) on 04/22/2014 10:58:02 AM      MDM   Final diagnoses:  Alcohol withdrawal delirium  Hypokalemia     the patient continues to have significant tremor, significant tachycardia, she has improved slightly with Ativan. I believe that her symptoms are likely related to the combination of the new medication but most likely related to alcohol withdrawal and possibly early delirium tremens. That being said every time I talked to the patient she appears alert oriented and appropriate and insightful. She is tearful, she realizes that this is likely related to her alcohol abuse, she is amenable to admission to the hospital. She is not in rhabdomyolysis, she does have mild hypokalemia which will be corrected. We'll discuss with the hospitalist. Second dose of Ativan given IV.  The pt has been  given multiple doses of ativan, she is now willing to stay, d/w Dr. Venetia Constable who will admit.  Meds given in ED:  Medications  LORazepam (ATIVAN) injection 2 mg (not administered)  LORazepam (ATIVAN) tablet 1 mg (1 mg Oral Given 04/22/14 0807)  LORazepam (ATIVAN) injection 2 mg (2 mg Intravenous Given 04/22/14 1002)  potassium chloride SA (K-DUR,KLOR-CON) CR tablet 40 mEq (40 mEq Oral Given 04/22/14 1038)    New Prescriptions   No medications on file    I personally performed the services described in this documentation, which was scribed in my presence. The recorded information has been reviewed and is accurate.         [name], MD 04/22/14 1058

## 2014-04-22 NOTE — ED Notes (Signed)
Pt reports's auditory hallucinations, EDP made aware.

## 2014-04-22 NOTE — Progress Notes (Signed)
eLink Physician-Brief Progress Note Patient Name: [name] DOB: 05-May-1965 MRN: [id number]   Date of Service  04/22/2014  HPI/Events of Note  agitatioon continues, some HTN associated  eICU Interventions  Add clonidine Escalate precedex to 1.4 max     Intervention Category Major Interventions: Change in mental status - evaluation and management  Raylene Miyamoto. 04/22/2014, 7:32 PM

## 2014-04-22 NOTE — Progress Notes (Signed)
eLink Physician-Brief Progress Note Patient Name: [name] DOB: 1965-04-30 MRN: [id number]   Date of Service  04/22/2014  HPI/Events of Note  Worsening agitation, DT worsneing  eICU Interventions  Switch to precedex, paged [facility] to discuss     Intervention Category Major Interventions: Change in mental status - evaluation and management  Baxter Gonzalez J. 04/22/2014, 5:31 PM

## 2014-04-22 NOTE — ED Notes (Signed)
Pt reports started taking dicyclomine 2 weeks ago and started having uncontrollable tremors intermittently.  Pt says came to ER last night for same.  Pt reports had similar symptoms prior to having her thyroid removed.

## 2014-04-23 LAB — CBC
HEMATOCRIT: 33.2 % — AB (ref 36.0–46.0)
Hemoglobin: 10.8 g/dL — ABNORMAL LOW (ref 12.0–15.0)
MCH: 28 pg (ref 26.0–34.0)
MCHC: 32.5 g/dL (ref 30.0–36.0)
MCV: 86 fL (ref 78.0–100.0)
PLATELETS: 211 10*3/uL (ref 150–400)
RBC: 3.86 MIL/uL — AB (ref 3.87–5.11)
RDW: 16 % — ABNORMAL HIGH (ref 11.5–15.5)
WBC: 4.6 10*3/uL (ref 4.0–10.5)

## 2014-04-23 LAB — COMPREHENSIVE METABOLIC PANEL
ALK PHOS: 85 U/L (ref 39–117)
ALT: 40 U/L — ABNORMAL HIGH (ref 0–35)
ANION GAP: 6 (ref 5–15)
AST: 24 U/L (ref 0–37)
Albumin: 3.4 g/dL — ABNORMAL LOW (ref 3.5–5.2)
BUN: 7 mg/dL (ref 6–23)
CALCIUM: 8.5 mg/dL (ref 8.4–10.5)
CO2: 25 mmol/L (ref 19–32)
Chloride: 104 mmol/L (ref 96–112)
Creatinine, Ser: 0.38 mg/dL — ABNORMAL LOW (ref 0.50–1.10)
GFR calc Af Amer: >90 mL/min (ref >90)
GLUCOSE: 115 mg/dL — AB (ref 70–99)
POTASSIUM: 3.4 mmol/L — AB (ref 3.5–5.1)
SODIUM: 135 mmol/L (ref 135–145)
TOTAL PROTEIN: 6.5 g/dL (ref 6.0–8.3)
Total Bilirubin: 1.2 mg/dL (ref 0.3–1.2)

## 2014-04-23 LAB — MAGNESIUM: Magnesium: 1.2 mg/dL — ABNORMAL LOW (ref 1.5–2.5)

## 2014-04-23 MED ORDER — DEXMEDETOMIDINE HCL IN NACL 200 MCG/50ML IV SOLN
INTRAVENOUS | Status: AC
  Filled 2014-04-23: qty 50

## 2014-04-23 MED ORDER — CLONIDINE HCL 0.3 MG/24HR TD PTWK: 0.3000 mg | MEDICATED_PATCH | TRANSDERMAL | Status: DC

## 2014-04-23 MED ORDER — DEXMEDETOMIDINE HCL IN NACL 200 MCG/50ML IV SOLN
0.2000 ug/kg/h | INTRAVENOUS | Status: DC
  Administered 2014-04-23 – 2014-04-24 (×5): 1 ug/kg/h via INTRAVENOUS
  Filled 2014-04-23 (×2): qty 100

## 2014-04-23 NOTE — Progress Notes (Signed)
Update Dr. Georges Mouse and son Doren Custard on patient's current status of drowsy and cooperative at this time.  Patient had complained about not sleeping over last 48 hours.  She was able to rest after anti-anxiety medications final took effect.

## 2014-04-23 NOTE — Progress Notes (Signed)
Subjective: She was admitted with alcohol and benzodiazepine withdrawal. She has been on Precedex. Her blood pressure is still quite elevated and although it probably is related to her withdrawal situation since she is calmer she is going to need more blood pressure medication. She has clonidine patch so I will increase the dose. This should help somewhat withdrawal as well.  Objective: Vital signs in last 24 hours: Temp:  [97.8 F (36.6 C)-98.7 F (37.1 C)] 97.8 F (36.6 C) (02/02 0400) Pulse Rate:  [44-113] 67 (02/02 0642) Resp:  [11-36] 20 (02/02 0642) BP: (125-205)/(61-167) 194/99 mmHg (02/02 0642) SpO2:  [95 %-100 %] 95 % (02/02 0642) Weight:  [76.2 kg (167 lb 15.9 oz)] 76.2 kg (167 lb 15.9 oz) (02/02 0500) Weight change:  Last BM Date: 04/21/14  Intake/Output from previous day: 02/01 0701 - 02/02 0700 In: 3014 [P.O.:480; I.V.:2534] Out: 900 [Urine:900]  PHYSICAL EXAM General appearance: She is sleepy but arousable. She says she is no longer having hallucinations. She does not have much tremor at this point. Resp: clear to auscultation bilaterally Cardio: regular rate and rhythm, S1, S2 normal, no murmur, click, rub or gallop GI: soft, non-tender; bowel sounds normal; no masses,  no organomegaly Extremities: extremities normal, atraumatic, no cyanosis or edema  Lab Results:  Results for orders placed or performed during the hospital encounter of 04/22/14 (from the past 48 hour(s))  CK     Status: None   Collection Time: 04/22/14  8:30 AM  Result Value Ref Range   Total CK 113 7 - 177 U/L  Comprehensive metabolic panel     Status: Abnormal   Collection Time: 04/22/14  8:30 AM  Result Value Ref Range   Sodium 137 135 - 145 mmol/L   Potassium 3.1 (L) 3.5 - 5.1 mmol/L   Chloride 99 96 - 112 mmol/L   CO2 26 19 - 32 mmol/L   Glucose, Bld 100 (H) 70 - 99 mg/dL   BUN 8 6 - 23 mg/dL   Creatinine, Ser 0.48 (L) 0.50 - 1.10 mg/dL   Calcium 9.3 8.4 - 10.5 mg/dL   Total Protein  7.4 6.0 - 8.3 g/dL   Albumin 3.9 3.5 - 5.2 g/dL   AST 37 0 - 37 U/L   ALT 57 (H) 0 - 35 U/L   Alkaline Phosphatase 109 39 - 117 U/L   Total Bilirubin 1.3 (H) 0.3 - 1.2 mg/dL   GFR calc non Af Amer >90 >90 mL/min   GFR calc Af Amer >90 >90 mL/min    Comment: (NOTE) The eGFR has been calculated using the CKD EPI equation. This calculation has not been validated in all clinical situations. eGFR's persistently <90 mL/min signify possible Chronic Kidney Disease.    Anion gap 12 5 - 15  CBC     Status: Abnormal   Collection Time: 04/22/14  8:30 AM  Result Value Ref Range   WBC 5.5 4.0 - 10.5 K/uL   RBC 4.13 3.87 - 5.11 MIL/uL   Hemoglobin 11.5 (L) 12.0 - 15.0 g/dL   HCT 35.3 (L) 36.0 - 46.0 %   MCV 85.5 78.0 - 100.0 fL   MCH 27.8 26.0 - 34.0 pg   MCHC 32.6 30.0 - 36.0 g/dL   RDW 16.0 (H) 11.5 - 15.5 %   Platelets 228 150 - 400 K/uL  Ethanol     Status: None   Collection Time: 04/22/14  8:30 AM  Result Value Ref Range   Alcohol, Ethyl (B) 5 0 -  9 mg/dL    Comment:        LOWEST DETECTABLE LIMIT FOR SERUM ALCOHOL IS 11 mg/dL FOR MEDICAL PURPOSES ONLY   TSH     Status: Abnormal   Collection Time: 04/22/14  8:30 AM  Result Value Ref Range   TSH 0.051 (L) 0.350 - 4.500 uIU/mL  MRSA PCR Screening     Status: None   Collection Time: 04/22/14  1:00 PM  Result Value Ref Range   MRSA by PCR NEGATIVE NEGATIVE    Comment:        The GeneXpert MRSA Assay (FDA approved for NASAL specimens only), is one component of a comprehensive MRSA colonization surveillance program. It is not intended to diagnose MRSA infection nor to guide or monitor treatment for MRSA infections.   Glucose, capillary     Status: Abnormal   Collection Time: 04/22/14  4:37 PM  Result Value Ref Range   Glucose-Capillary 150 (H) 70 - 99 mg/dL    ABGS No results for input(s): PHART, PO2ART, TCO2, HCO3 in the last 72 hours.  Invalid input(s): PCO2 CULTURES Recent Results (from the past 240 hour(s))   MRSA PCR Screening     Status: None   Collection Time: 04/22/14  1:00 PM  Result Value Ref Range Status   MRSA by PCR NEGATIVE NEGATIVE Final    Comment:        The GeneXpert MRSA Assay (FDA approved for NASAL specimens only), is one component of a comprehensive MRSA colonization surveillance program. It is not intended to diagnose MRSA infection nor to guide or monitor treatment for MRSA infections.    Studies/Results: No results found.  Medications:  Prior to Admission:  Prescriptions prior to admission  Medication Sig Dispense Refill Last Dose  . ALPRAZolam (XANAX) 0.5 MG tablet Take 1 tablet (0.5 mg total) by mouth 3 (three) times daily as needed for anxiety. 30 tablet 0 04/21/2014 at Unknown time  . diphenhydrAMINE (BENADRYL) 25 MG tablet Take 50 mg by mouth as needed for allergies.    04/22/2014 at Unknown time  . docusate sodium (COLACE) 100 MG capsule Take 2 capsules (200 mg total) by mouth daily. 10 capsule 0 04/21/2014 at Unknown time  . ibuprofen (ADVIL,MOTRIN) 200 MG tablet Take 600 mg by mouth every 6 (six) hours as needed for moderate pain.   04/21/2014 at Unknown time  . levothyroxine (SYNTHROID, LEVOTHROID) 200 MCG tablet Take 200 mcg by mouth daily before breakfast.   04/21/2014 at Unknown time  . lisinopril (PRINIVIL,ZESTRIL) 10 MG tablet Take 10 mg by mouth daily.   04/22/2014 at Unknown time  . metoprolol succinate (TOPROL-XL) 25 MG 24 hr tablet Take 25 mg by mouth daily.   04/20/2014 at 8-9 AM  . ondansetron (ZOFRAN) 8 MG tablet Take 1 tablet (8 mg total) by mouth every 8 (eight) hours as needed. 8 tablet 0 Past Week at Unknown time   Scheduled: . [START ON 04/29/2014] cloNIDine  0.3 mg Transdermal Weekly  . docusate sodium  200 mg Oral Daily  . enoxaparin (LOVENOX) injection  40 mg Subcutaneous Q24H  . folic acid  1 mg Oral Daily  . levothyroxine  200 mcg Oral QAC breakfast  . lisinopril  10 mg Oral Daily  . LORazepam  2 mg Intravenous Q6H  . metoprolol  5 mg  Intravenous 4 times per day  . multivitamin with minerals  1 tablet Oral Daily  . nicotine  21 mg Transdermal Daily  . pneumococcal 23 valent vaccine  0.5 mL Intramuscular Tomorrow-1000  . sodium chloride  3 mL Intravenous Q12H  . thiamine  100 mg Oral Daily   Or  . thiamine  100 mg Intravenous Daily   Continuous: . sodium chloride 125 mL/hr at 04/23/14 0452  . dexmedetomidine 1 mcg/kg/hr (04/23/14 5189)   [id number] **OR** acetaminophen, alum & mag hydroxide-simeth, diphenhydrAMINE, hydrALAZINE, midazolam, morphine injection, ondansetron **OR** ondansetron (ZOFRAN) IV  Assesment: She is here with alcohol and benzodiazepine withdrawal. She was hypokalemic on admission and that has been replaced but repeat potassium is still pending. She is known to have hypertension and her blood pressure is still up despite her being much calmer. Principal Problem:   Alcohol withdrawal Active Problems:   Essential hypertension, benign   Hypothyroidism   Hypokalemia   Tachycardia   Alcohol withdrawal with inpatient treatment    Plan: Continue current treatments. See if we can reduce the dose of Precedex. Increase the clonidine. When she is on less medication she will need a psychiatry consult    LOS: 1 day   Nyoka Alcoser L 04/23/2014, 7:32 AM

## 2014-04-23 NOTE — Progress Notes (Signed)
Discussed with mid-level Hospitalist about changing patient's medication's from PO to IV since she was uncooperative and agitated to take needed blood pressure medications at this time.

## 2014-04-23 NOTE — Progress Notes (Signed)
Discussed action plan with Elzie Rings RN from [facility] about needed medications given and next steps to take.  Even discussed with her IV medications may not of had the full effect with her bending arms and tightening on the side rails in the bed. Dr. Conan Bowens still had not come to the monitor at this time to discuss the patient's condition.

## 2014-04-23 NOTE — Progress Notes (Addendum)
Dr. Conan Bowens was placed on hold and disconnected before speaking to him.  Patient's son's girlfriend Melody had called Dr. Conan Bowens directly at [facility] discussing the patient's situation (son not present at the time).  Orders were changed without discussion to current nurse and on-call Hospitalist called to the floor at this time.  Dr. Georges Mouse placed several orders after seeing the patient.

## 2014-04-23 NOTE — Progress Notes (Addendum)
Pt's Son Yeraldine Forney) came into unit to check on pt and asked that staff notify him when/if his mother is moved from the ICU to the general medical floor. Pt's emergency contact information has been updated to include son's phone number.

## 2014-04-23 NOTE — Progress Notes (Signed)
Paged E-link to discuss Precedex order since told to call if patient needed higher than 0.7 mcg/kg/hr.  Talked to [place] at [facility] who discussed Dr. Conan Bowens that he had changed orders to 1.4 mcg/kg/hr.  When trying to discuss the patient's continued agitated condition the Dr. Conan Bowens did not come on the monitor conversation at the time.

## 2014-04-23 NOTE — Progress Notes (Addendum)
[name] [phone]) son asked for new password [place]. He would give new password to father and brother.

## 2014-04-23 NOTE — Care Management Utilization Note (Signed)
UR completed 

## 2014-04-24 DIAGNOSIS — F13939 Sedative, hypnotic or anxiolytic use, unspecified with withdrawal, unspecified: Secondary | ICD-10-CM | POA: Insufficient documentation

## 2014-04-24 DIAGNOSIS — F10239 Alcohol dependence with withdrawal, unspecified: Secondary | ICD-10-CM

## 2014-04-24 DIAGNOSIS — F13239 Sedative, hypnotic or anxiolytic dependence with withdrawal, unspecified: Secondary | ICD-10-CM

## 2014-04-24 MED ORDER — METOPROLOL SUCCINATE ER 50 MG PO TB24
50.0000 mg | ORAL_TABLET | Freq: Every day | ORAL | Status: DC
  Administered 2014-04-24: 50 mg via ORAL
  Filled 2014-04-24: qty 1

## 2014-04-24 MED ORDER — DEXMEDETOMIDINE HCL IN NACL 200 MCG/50ML IV SOLN: 0.2000 ug/kg/h | INTRAVENOUS | Status: AC

## 2014-04-24 MED ORDER — CLONIDINE HCL 0.3 MG/24HR TD PTWK: 0.3000 mg | MEDICATED_PATCH | TRANSDERMAL | Status: AC

## 2014-04-24 NOTE — BH Assessment (Signed)
Writer called [employer] back and told him Catalina Pizza NP would be available to do the telepsych assessment at 1:15 pm rather than the originally scheduled noon TP. Lawrence's # is [phone].  Arnold Long, [place] Assessment Counselor [phone]

## 2014-04-24 NOTE — Care Management Note (Signed)
    Page 1 of 1   04/24/2014     11:20:50 AM CARE MANAGEMENT NOTE 04/24/2014  Patient:  WESTLYNN, FIFER   Account Number:  [id number]  Date Initiated:  04/24/2014  Documentation initiated by:  Jolene Provost  Subjective/Objective Assessment:   Pt admitted for alcohol withdrawal. Pt is from home with self care. Pt has no HH services, DME's or med needs prior to admission. Pt plans to discharge home with self care possibly today.     Action/Plan:   CSW aware of substance abuse. Pt to have TTS consult prior to discharge. Pt has no CM needs identified.   Anticipated DC Date:  04/24/2014   Anticipated DC Plan:  [place]  CM consult      Choice offered to / List presented to:             Status of service:  Completed, signed off Medicare Important Message given?   (If response is "NO", the following Medicare IM given date fields will be blank) Date Medicare IM given:   Medicare IM given by:   Date Additional Medicare IM given:   Additional Medicare IM given by:    Discharge Disposition:  HOME/SELF CARE  Per UR Regulation:  Reviewed for med. necessity/level of care/duration of stay  If discussed at [place] of Stay Meetings, dates discussed:    Comments:  04/24/2014 [place], RN, MSN, CM

## 2014-04-24 NOTE — Clinical Social Work Note (Signed)
Pt awaiting psych assessment. CSW will follow up.  [name], [place]

## 2014-04-24 NOTE — Progress Notes (Signed)
Patient is being discharged by Dr. Luan Pulling tonight. Patient has asked to stay till 23 until husband gets off work.

## 2014-04-24 NOTE — Progress Notes (Signed)
Subjective: She is more alert. She is still receiving Precedex. She has no new complaints.  Objective: Vital signs in last 24 hours: Temp:  [97.6 F (36.4 C)-98.8 F (37.1 C)] 98.3 F (36.8 C) (02/03 0400) Pulse Rate:  [57-90] 85 (02/03 0600) Resp:  [14-29] 16 (02/03 0800) BP: (122-207)/(77-169) 164/93 mmHg (02/03 0800) SpO2:  [96 %-100 %] 100 % (02/03 0600) Weight:  [76.2 kg (167 lb 15.9 oz)] 76.2 kg (167 lb 15.9 oz) (02/03 0500) Weight change: 0 kg (0 lb) Last BM Date: 04/21/14  Intake/Output from previous day: 02/02 0701 - 02/03 0700 In: 4468.9 [P.O.:1200; I.V.:3268.9] Out: 4000 [Urine:4000]  PHYSICAL EXAM General appearance: alert, cooperative and no distress Resp: clear to auscultation bilaterally Cardio: regular rate and rhythm, S1, S2 normal, no murmur, click, rub or gallop GI: soft, non-tender; bowel sounds normal; no masses,  no organomegaly Extremities: extremities normal, atraumatic, no cyanosis or edema  Lab Results:  Results for orders placed or performed during the hospital encounter of 04/22/14 (from the past 48 hour(s))  MRSA PCR Screening     Status: None   Collection Time: 04/22/14  1:00 PM  Result Value Ref Range   MRSA by PCR NEGATIVE NEGATIVE    Comment:        The GeneXpert MRSA Assay (FDA approved for NASAL specimens only), is one component of a comprehensive MRSA colonization surveillance program. It is not intended to diagnose MRSA infection nor to guide or monitor treatment for MRSA infections.   Glucose, capillary     Status: Abnormal   Collection Time: 04/22/14  4:37 PM  Result Value Ref Range   Glucose-Capillary 150 (H) 70 - 99 mg/dL  Comprehensive metabolic panel     Status: Abnormal   Collection Time: 04/23/14  7:53 AM  Result Value Ref Range   Sodium 135 135 - 145 mmol/L   Potassium 3.4 (L) 3.5 - 5.1 mmol/L   Chloride 104 96 - 112 mmol/L   CO2 25 19 - 32 mmol/L   Glucose, Bld 115 (H) 70 - 99 mg/dL   BUN 7 6 - 23 mg/dL   Creatinine, Ser 0.38 (L) 0.50 - 1.10 mg/dL   Calcium 8.5 8.4 - 10.5 mg/dL   Total Protein 6.5 6.0 - 8.3 g/dL   Albumin 3.4 (L) 3.5 - 5.2 g/dL   AST 24 0 - 37 U/L   ALT 40 (H) 0 - 35 U/L   Alkaline Phosphatase 85 39 - 117 U/L   Total Bilirubin 1.2 0.3 - 1.2 mg/dL   GFR calc non Af Amer >90 >90 mL/min   GFR calc Af Amer >90 >90 mL/min    Comment: (NOTE) The eGFR has been calculated using the CKD EPI equation. This calculation has not been validated in all clinical situations. eGFR's persistently <90 mL/min signify possible Chronic Kidney Disease.    Anion gap 6 5 - 15  CBC     Status: Abnormal   Collection Time: 04/23/14  7:53 AM  Result Value Ref Range   WBC 4.6 4.0 - 10.5 K/uL   RBC 3.86 (L) 3.87 - 5.11 MIL/uL   Hemoglobin 10.8 (L) 12.0 - 15.0 g/dL   HCT 33.2 (L) 36.0 - 46.0 %   MCV 86.0 78.0 - 100.0 fL   MCH 28.0 26.0 - 34.0 pg   MCHC 32.5 30.0 - 36.0 g/dL   RDW 16.0 (H) 11.5 - 15.5 %   Platelets 211 150 - 400 K/uL  Magnesium     Status: Abnormal  Collection Time: 04/23/14  7:53 AM  Result Value Ref Range   Magnesium 1.2 (L) 1.5 - 2.5 mg/dL    ABGS No results for input(s): PHART, PO2ART, TCO2, HCO3 in the last 72 hours.  Invalid input(s): PCO2 CULTURES Recent Results (from the past 240 hour(s))  MRSA PCR Screening     Status: None   Collection Time: 04/22/14  1:00 PM  Result Value Ref Range Status   MRSA by PCR NEGATIVE NEGATIVE Final    Comment:        The GeneXpert MRSA Assay (FDA approved for NASAL specimens only), is one component of a comprehensive MRSA colonization surveillance program. It is not intended to diagnose MRSA infection nor to guide or monitor treatment for MRSA infections.    Studies/Results: No results found.  Medications:  Prior to Admission:  Prescriptions prior to admission  Medication Sig Dispense Refill Last Dose  . ALPRAZolam (XANAX) 0.5 MG tablet Take 1 tablet (0.5 mg total) by mouth 3 (three) times daily as needed for  anxiety. 30 tablet 0 04/21/2014 at Unknown time  . diphenhydrAMINE (BENADRYL) 25 MG tablet Take 50 mg by mouth as needed for allergies.    04/22/2014 at Unknown time  . docusate sodium (COLACE) 100 MG capsule Take 2 capsules (200 mg total) by mouth daily. 10 capsule 0 04/21/2014 at Unknown time  . ibuprofen (ADVIL,MOTRIN) 200 MG tablet Take 600 mg by mouth every 6 (six) hours as needed for moderate pain.   04/21/2014 at Unknown time  . levothyroxine (SYNTHROID, LEVOTHROID) 200 MCG tablet Take 200 mcg by mouth daily before breakfast.   04/21/2014 at Unknown time  . lisinopril (PRINIVIL,ZESTRIL) 10 MG tablet Take 10 mg by mouth daily.   04/22/2014 at Unknown time  . metoprolol succinate (TOPROL-XL) 25 MG 24 hr tablet Take 25 mg by mouth daily.   04/20/2014 at 8-9 AM  . ondansetron (ZOFRAN) 8 MG tablet Take 1 tablet (8 mg total) by mouth every 8 (eight) hours as needed. 8 tablet 0 Past Week at Unknown time   Scheduled: . [START ON 04/29/2014] cloNIDine  0.3 mg Transdermal Weekly  . dexmedetomidine      . docusate sodium  200 mg Oral Daily  . enoxaparin (LOVENOX) injection  40 mg Subcutaneous Q24H  . folic acid  1 mg Oral Daily  . levothyroxine  200 mcg Oral QAC breakfast  . lisinopril  10 mg Oral Daily  . LORazepam  2 mg Intravenous Q6H  . metoprolol  5 mg Intravenous 4 times per day  . metoprolol succinate  50 mg Oral Daily  . multivitamin with minerals  1 tablet Oral Daily  . nicotine  21 mg Transdermal Daily  . pneumococcal 23 valent vaccine  0.5 mL Intramuscular Tomorrow-1000  . sodium chloride  3 mL Intravenous Q12H  . thiamine  100 mg Oral Daily   Or  . thiamine  100 mg Intravenous Daily   Continuous: . sodium chloride 125 mL/hr at 04/24/14 0600  . dexmedetomidine     [id number] **OR** acetaminophen, alum & mag hydroxide-simeth, diphenhydrAMINE, hydrALAZINE, midazolam, morphine injection, ondansetron **OR** ondansetron (ZOFRAN) IV  Assesment: He was admitted with alcohol and  benzodiazepine withdrawal. She is improving but is still on Precedex. Her blood pressure is still too high. Principal Problem:   Alcohol withdrawal Active Problems:   Essential hypertension, benign   Hypothyroidism   Hypokalemia   Tachycardia   Alcohol withdrawal with inpatient treatment    Plan: Try to get her off  of the Precedex today. tele psychiatry consult today    LOS: 2 days   Andrew Blasius L 04/24/2014, 8:39 AM
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
## 2014-04-29 ENCOUNTER — Other Ambulatory Visit (HOSPITAL_COMMUNITY)
Admission: RE | Admit: 2014-04-29 | Discharge: 2014-04-29 | Disposition: A | Discharge status: Home / Self Care | Payer: 59 | Source: Ambulatory Visit | Attending: Pulmonary Disease | Admitting: Pulmonary Disease

## 2014-04-29 DIAGNOSIS — E876 Hypokalemia: Secondary | ICD-10-CM | POA: Diagnosis present

## 2014-04-29 LAB — POTASSIUM: Potassium: 3.3 mmol/L — ABNORMAL LOW (ref 3.5–5.1)

## 2014-05-03 NOTE — Discharge Summary (Signed)
Physician Discharge Summary  Patient ID: VERNECIA UMBLE MRN: [id number] DOB/AGE: [id number] 49 y.o. Primary Care Physician:Samika Vetsch L, MD Admit date: 04/22/2014 Discharge date: 05/03/2014    Discharge Diagnoses:   Principal Problem:   Alcohol withdrawal Active Problems:   Essential hypertension, benign   Hypothyroidism   Hypokalemia   Tachycardia   Alcohol withdrawal with inpatient treatment   Benzodiazepine withdrawal     Medication List    TAKE these medications        ALPRAZolam 0.5 MG tablet  Commonly known as:  XANAX  Take 1 tablet (0.5 mg total) by mouth 3 (three) times daily as needed for anxiety.     cloNIDine 0.3 mg/24hr patch  Commonly known as:  CATAPRES - Dosed in mg/24 hr  Place 1 patch (0.3 mg total) onto the skin once a week.     diphenhydrAMINE 25 MG tablet  Commonly known as:  BENADRYL  Take 50 mg by mouth as needed for allergies.     docusate sodium 100 MG capsule  Commonly known as:  COLACE  Take 2 capsules (200 mg total) by mouth daily.     ibuprofen 200 MG tablet  Commonly known as:  ADVIL,MOTRIN  Take 600 mg by mouth every 6 (six) hours as needed for moderate pain.     levothyroxine 200 MCG tablet  Commonly known as:  SYNTHROID, LEVOTHROID  Take 200 mcg by mouth daily before breakfast.     lisinopril 10 MG tablet  Commonly known as:  PRINIVIL,ZESTRIL  Take 10 mg by mouth daily.     metoprolol succinate 25 MG 24 hr tablet  Commonly known as:  TOPROL-XL  Take 25 mg by mouth daily.     ondansetron 8 MG tablet  Commonly known as:  ZOFRAN  Take 1 tablet (8 mg total) by mouth every 8 (eight) hours as needed.        Discharged Condition: Improved    Consults: Psychiatry  Significant Diagnostic Studies: No results found.  Lab Results: Basic Metabolic Panel: No results for input(s): NA, K, CL, CO2, GLUCOSE, BUN, CREATININE, CALCIUM, MG, PHOS in the last 72 hours. Liver Function Tests: No results for input(s): AST,  ALT, ALKPHOS, BILITOT, PROT, ALBUMIN in the last 72 hours.   CBC: No results for input(s): WBC, NEUTROABS, HGB, HCT, MCV, PLT in the last 72 hours.  No results found for this or any previous visit (from the past 240 hour(s)).   Hospital Course: This is a 49 year old who has been having increasing problems with anxiety and has had increasing use of alcohol and benzodiazepines. She has been trying to stop drinking and did stop drinking fairly abruptly and had been reducing her dose of benzodiazepines that she became extremely agitated and anxious and had hallucinations. She was brought to the emergency department and was found to have alcohol and benzodiazepine withdrawal. She was started on protocol but still had significant agitation and eventually required Precedex. She improved with this Precedex was tapered off and she had tele-psychiatry consultation. They felt that she did not need inpatient treatment at that time but close follow-up as an outpatient.  Discharge Exam: Blood pressure 162/93, pulse 83, temperature 98.7 F (37.1 C), temperature source Oral, resp. rate 16, height 5\' 5"  (1.651 m), weight 76.2 kg (167 lb 15.9 oz), SpO2 100 %. She is awake and alert. Her chest is clear. Heart is regular.  Disposition: Home. Close follow-up with outpatient counseling      Discharge Instructions  Discharge patient    Complete by:  As directed              Signed: Paulino Cork L   05/03/2014, 8:28 AM

## 2014-09-05 ENCOUNTER — Telehealth: Payer: Self-pay | Admitting: Gastroenterology

## 2014-09-05 MED ORDER — AZITHROMYCIN 250 MG PO TABS: ORAL_TABLET | ORAL | Status: AC

## 2014-09-05 NOTE — Telephone Encounter (Signed)
PT C/O LEFT SIDE FACE PAIN ASSOCIATED WITH YELLOW DRAINAGE. NO TOOTH PAIN OR DENTAL CARIES. OW GARDE FEVER (< 101F)  PT DENIES FEVER, CHILLS,  nausea, vomiting, diarrhea, CHEST PAIN, SHORTNESS OF BREATH,  CHANGE IN BOWEL IN HABITS, constipation, abdominal pain, problems swallowing, OR heartburn or indigestion.

## 2014-10-24 ENCOUNTER — Ambulatory Visit (INDEPENDENT_AMBULATORY_CARE_PROVIDER_SITE_OTHER): Payer: 59 | Admitting: Internal Medicine

## 2015-02-24 ENCOUNTER — Emergency Department (HOSPITAL_COMMUNITY): Payer: 59 | Admitting: Anesthesiology

## 2015-02-24 ENCOUNTER — Emergency Department (HOSPITAL_COMMUNITY)
Admission: EM | Admit: 2015-02-24 | Discharge: 2015-03-23 | Disposition: E | Payer: 59 | Attending: Emergency Medicine | Admitting: Emergency Medicine

## 2015-02-24 DIAGNOSIS — F17210 Nicotine dependence, cigarettes, uncomplicated: Secondary | ICD-10-CM | POA: Insufficient documentation

## 2015-02-24 DIAGNOSIS — Y998 Other external cause status: Secondary | ICD-10-CM | POA: Insufficient documentation

## 2015-02-24 DIAGNOSIS — I469 Cardiac arrest, cause unspecified: Secondary | ICD-10-CM | POA: Diagnosis present

## 2015-02-24 DIAGNOSIS — Y9289 Other specified places as the place of occurrence of the external cause: Secondary | ICD-10-CM | POA: Insufficient documentation

## 2015-02-24 DIAGNOSIS — I10 Essential (primary) hypertension: Secondary | ICD-10-CM | POA: Diagnosis not present

## 2015-02-24 DIAGNOSIS — T464X5A Adverse effect of angiotensin-converting-enzyme inhibitors, initial encounter: Secondary | ICD-10-CM | POA: Diagnosis not present

## 2015-02-24 DIAGNOSIS — Z8719 Personal history of other diseases of the digestive system: Secondary | ICD-10-CM | POA: Insufficient documentation

## 2015-02-24 DIAGNOSIS — T783XXA Angioneurotic edema, initial encounter: Secondary | ICD-10-CM | POA: Insufficient documentation

## 2015-02-24 DIAGNOSIS — X58XXXA Exposure to other specified factors, initial encounter: Secondary | ICD-10-CM | POA: Diagnosis not present

## 2015-02-24 DIAGNOSIS — E079 Disorder of thyroid, unspecified: Secondary | ICD-10-CM | POA: Diagnosis not present

## 2015-02-24 DIAGNOSIS — Z79899 Other long term (current) drug therapy: Secondary | ICD-10-CM | POA: Diagnosis not present

## 2015-02-24 DIAGNOSIS — L80 Vitiligo: Secondary | ICD-10-CM | POA: Diagnosis not present

## 2015-02-24 DIAGNOSIS — Z880 Allergy status to penicillin: Secondary | ICD-10-CM | POA: Diagnosis not present

## 2015-02-24 DIAGNOSIS — R092 Respiratory arrest: Secondary | ICD-10-CM | POA: Insufficient documentation

## 2015-02-24 DIAGNOSIS — Y9389 Activity, other specified: Secondary | ICD-10-CM | POA: Diagnosis not present

## 2015-02-24 IMAGING — CT CT ABD-PELV W/ CM
2 of 4 series · 16 of 46 positions shown, 18 images · IV contrast (Omnipaque 300)
Comparison: None.

CLINICAL DATA: Abdominal pain.  Blood in stool

EXAM:
CT ABDOMEN AND PELVIS WITH CONTRAST
TECHNIQUE: Multidetector CT imaging of the abdomen and pelvis was performed
using the standard protocol following bolus administration of
intravenous contrast.
CONTRAST:  100mL OMNIPAQUE IOHEXOL 300 MG/ML  SOLN

[Series 2: abd_pel_with 5.0 b40f · axial · 0.66mm/px · z∈[-430,-20]mm · 13 of 92 slices shown, 15 images]
[im 5/92  soft-tissue]
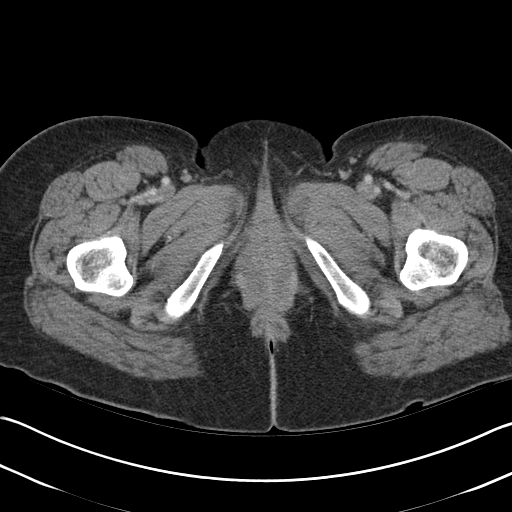
[im 5/92  bone]
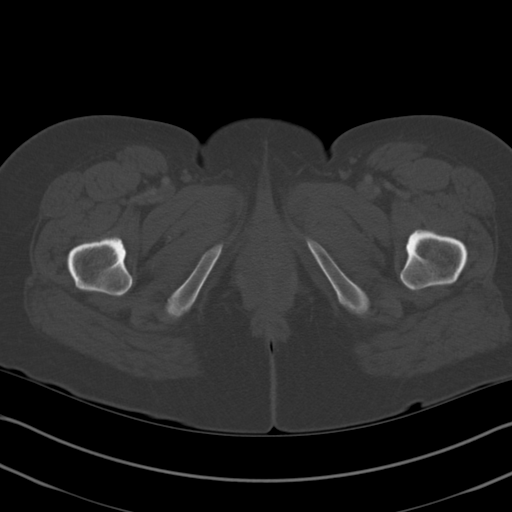
[im 14/92  soft-tissue]
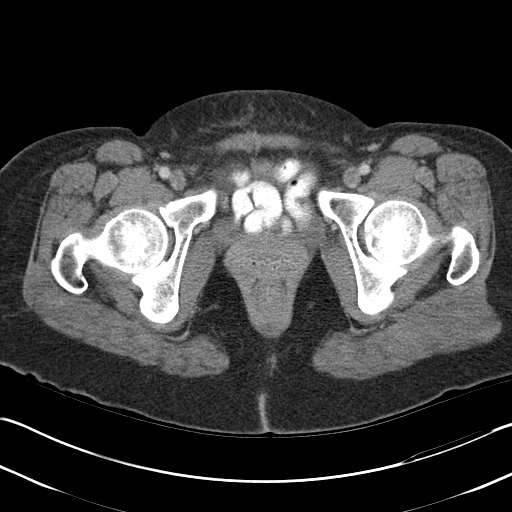
[im 18/92  soft-tissue]
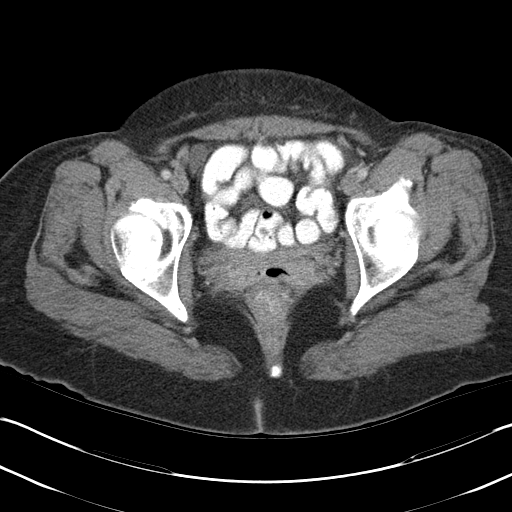
[im 27/92  soft-tissue]
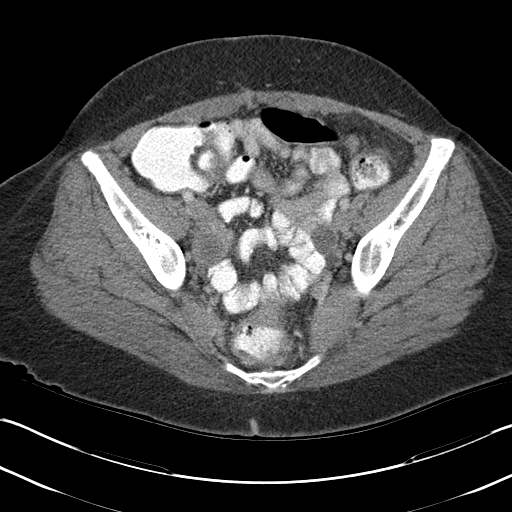
[im 31/92  soft-tissue]
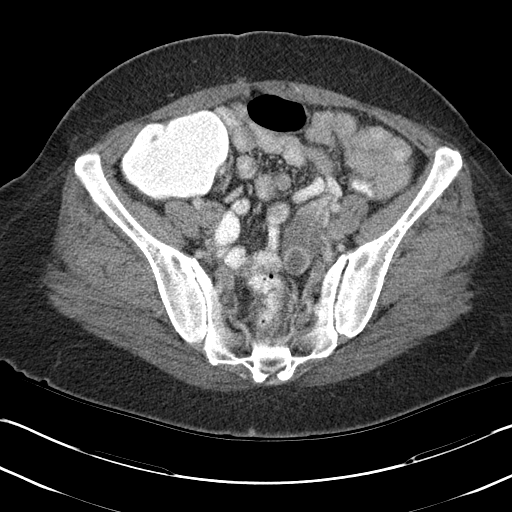
[im 40/92  soft-tissue]
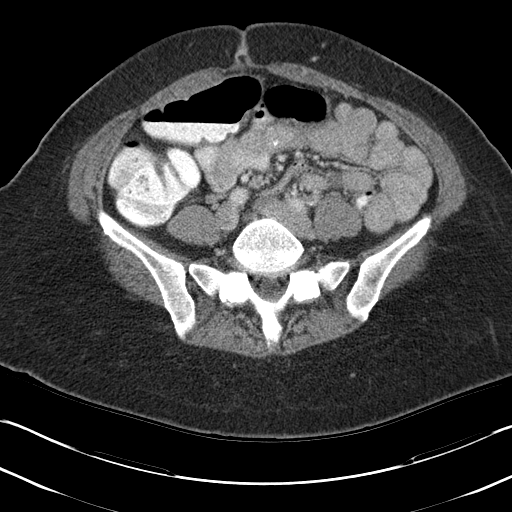
[im 48/92  soft-tissue]
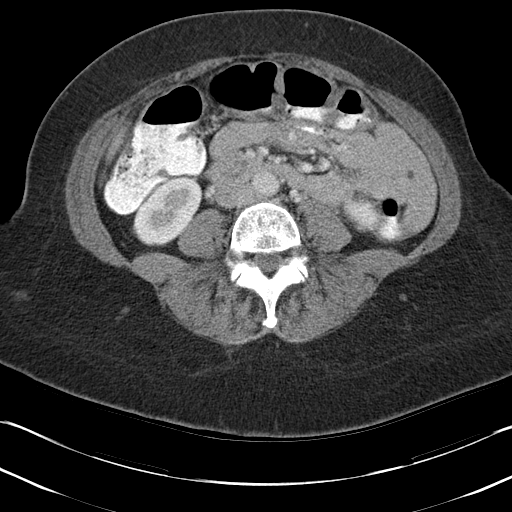
[im 53/92  soft-tissue]
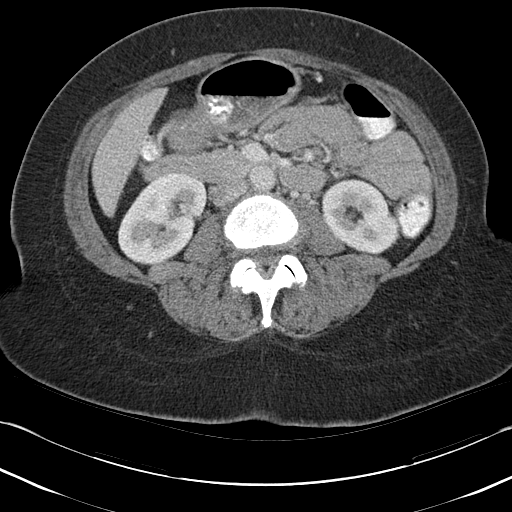
[im 61/92  soft-tissue]
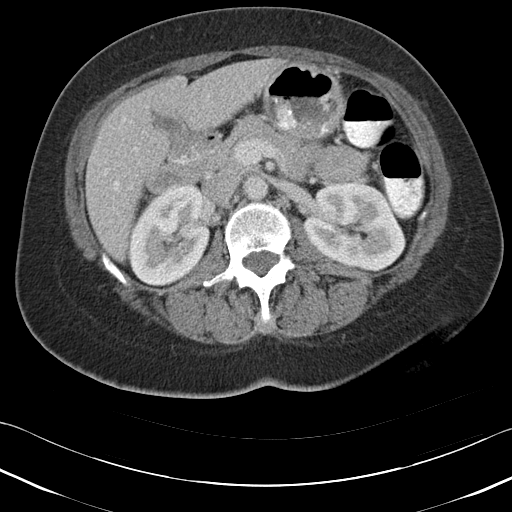
[im 61/92  bone]
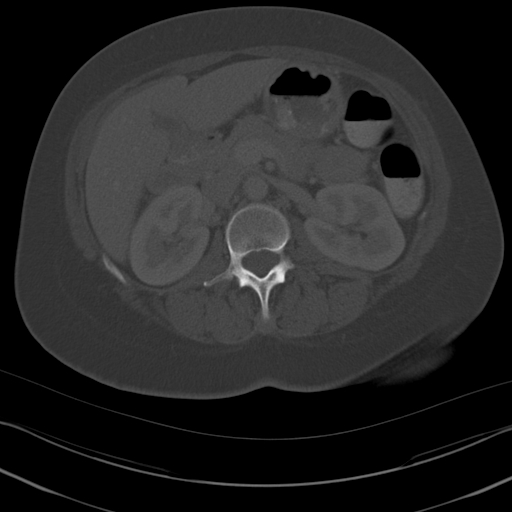
[im 66/92  soft-tissue]
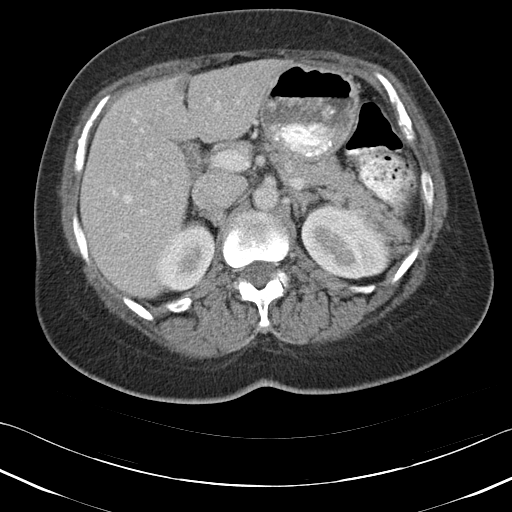
[im 74/92  soft-tissue]
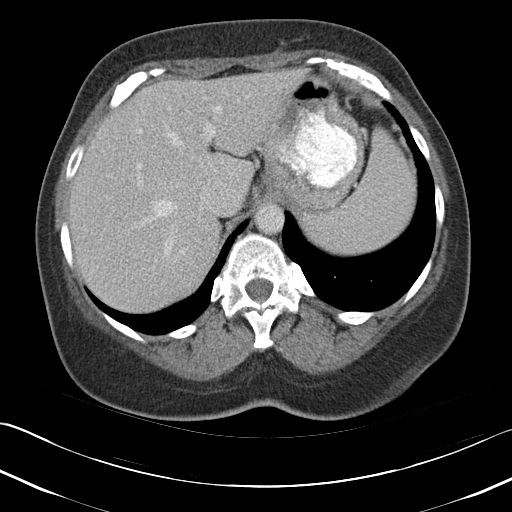
[im 79/92  soft-tissue]
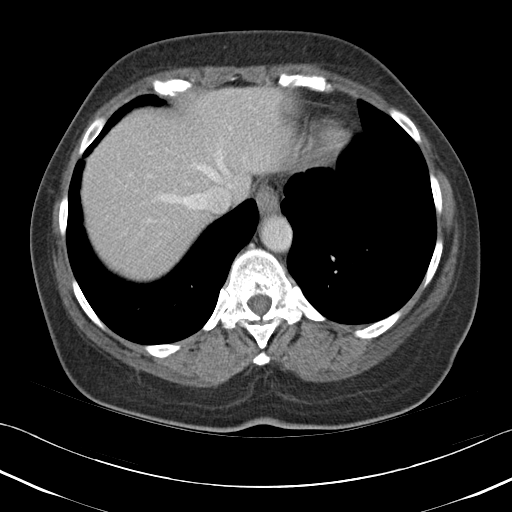
[im 87/92  soft-tissue]
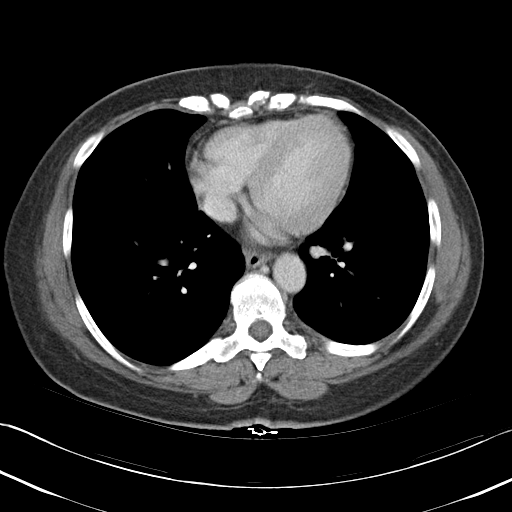

[Series 3: abd_pel_with 3.0 spo cor · coronal · 0.74mm/px · 3 of 74 slices shown]
[im 25/74  soft-tissue]
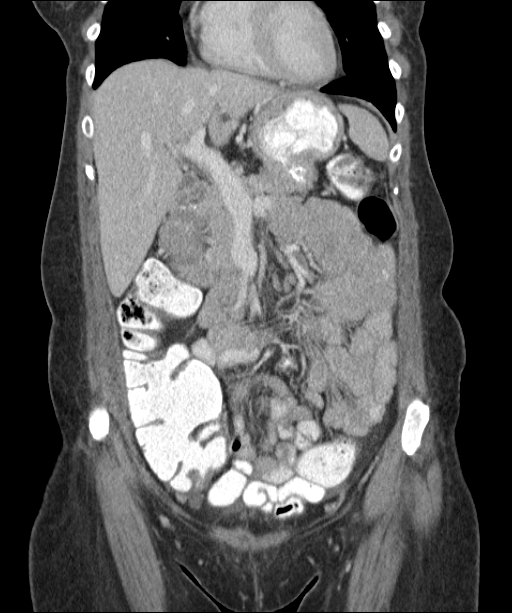
[im 33/74  soft-tissue]
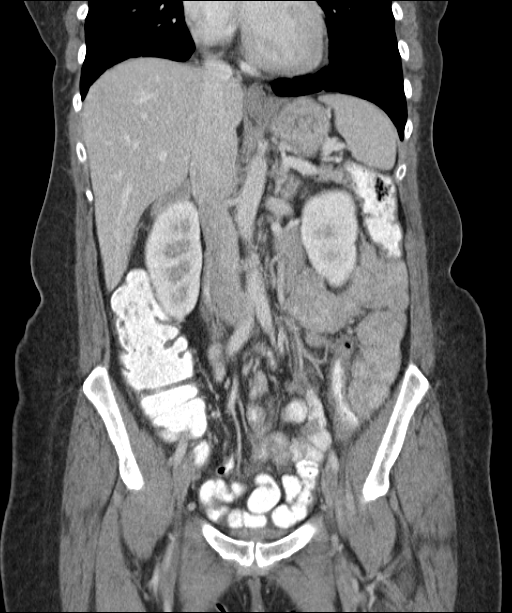
[im 41/74  soft-tissue]
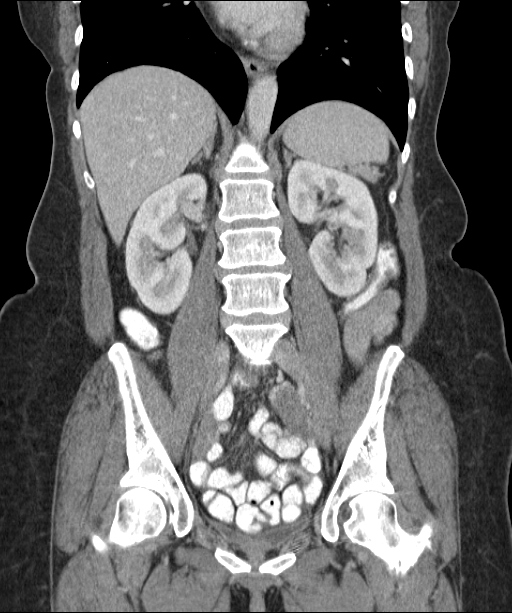

[16 of 46 positions shown; findings below may reference images not displayed]

FINDINGS: No pleural or pericardial effusion.  The lung bases appear clear.

4 mm low attenuation structure within segment 7 of the liver is
noted. Too small to characterize. The gallbladder appears normal. No
biliary dilatation. Normal appearance of the pancreas. The spleen is
unremarkable.

Normal appearance of the adrenal glands. The right kidney is normal.
The left kidney is also normal. Urinary bladder is collapsed.
Previous hysterectomy. Normal appearance of the adnexal structures.

The abdominal aorta has a normal caliber. No enlarged upper
abdominal lymph nodes. No mesenteric adenopathy. There is no pelvic
or inguinal adenopathy. No free fluid or fluid collections within
the abdomen or pelvis.

The stomach is normal. The small bowel loops have a normal course
and caliber. Normal appearance of the colon.

Review of the visualized osseous structures is negative for
aggressive lytic or sclerotic bone lesion.
IMPRESSION: 1. No acute findings. No explanation for patient's abdominal pain
and bloody stools.

## 2015-02-24 MED ORDER — EPINEPHRINE HCL 0.1 MG/ML IJ SOSY
PREFILLED_SYRINGE | INTRAMUSCULAR | Status: AC | PRN
  Administered 2015-02-24 (×3): 1 mg via INTRAVENOUS

## 2015-02-24 MED ORDER — ATROPINE SULFATE 1 MG/ML IJ SOLN
INTRAMUSCULAR | Status: AC | PRN
  Administered 2015-02-24 (×2): 1 mg via INTRAVENOUS

## 2015-02-24 MED ORDER — EPINEPHRINE HCL 0.1 MG/ML IJ SOSY
PREFILLED_SYRINGE | INTRAMUSCULAR | Status: AC | PRN
  Administered 2015-02-24 (×5): 1 mg via INTRAVENOUS

## 2015-02-25 MED FILL — Medication: Qty: 3 | Status: AC

## 2015-03-23 NOTE — Code Documentation (Signed)
Multiple failed attempts at intubation by Dr Rolland Porter and CRNA Tressie Stalker due to swelling of airway.    Failed attempt at cricoid placement by Dr Rolland Porter due to swelling

## 2015-03-23 NOTE — ED Provider Notes (Signed)
CSN: [id number]     Arrival date & time Mar 26, 2015  0240 History   First MD Initiated Contact with Patient March 26, 2015 0240   Chief Complaint  Patient presents with  . Cardiac Arrest   Level V caveat for being unresponsive  (Consider location/radiation/quality/duration/timing/severity/associated sxs/prior Treatment) HPI EMS states they were called for patient complaining of feeling like her throat was closing and possible allergic reaction. They states she was talking and they gave her IV Solu-Medrol and Benadryl and albuterol nebulizer. They also gave her at the 0.3 mg 2. They report shortly before getting to the ED her tongue started swelling and she started having more difficulty breathing and became unconscious. Husband was talked to after patient was pronounced however he states he went to [facility] when she started complaining of difficulty breathing and feeling like her throat was swelling up about 1 AM. She took 2 Benadryl. He states she has been on lisinopril for a while.   PCP Dr. Luan Pulling  Past Medical History  Diagnosis Date  . Hypertension   . Thyroid disorder   . Rectal bleed   . ETOH abuse   . Tobacco abuse    Past Surgical History  Procedure Laterality Date  . Abdominal hysterectomy  2011  . Colonoscopy    . Upper gastrointestinal endoscopy  2001    [facility]/ Dr.Mann  . Hemorroidectomy  2004  . Thyroidectomy     Family History  Problem Relation Age of Onset  . Hypertension Mother   . Diabetes Mother   . Anxiety disorder Mother   . Heart disease Father   . Kidney disease Father   . Healthy Brother   . Healthy Paternal Grandfather   . Hypertension Brother   . Gout Brother   . Diverticulosis Brother   . Hypertension Brother   . Gout Brother   . Healthy Son    Social History  Substance Use Topics  . Smoking status: Current Every Day Smoker -- 0.50 packs/day    Types: Cigarettes  . Smokeless tobacco: Never Used  . Alcohol Use: Yes     Comment: Social    Employed Lives with spouse  OB History    Gravida Para Term Preterm AB TAB SAB Ectopic Multiple Living   2 2 2             Review of Systems  Unable to perform ROS: Patient unresponsive      Allergies  Dicyclomine; Morphine and related; and Penicillins  Home Medications   Prior to Admission medications   Medication Sig Start Date End Date Taking? Authorizing Provider  ALPRAZolam Duanne Moron) 0.5 MG tablet Take 1 tablet (0.5 mg total) by mouth 3 (three) times daily as needed for anxiety. 04/21/14   Fredia Sorrow, MD  azithromycin (ZITHROMAX) 250 MG tablet 2 PO TODAY THEN 1 PO  FOR 4 DAYS 09/05/14   Danie Binder, MD  cloNIDine (CATAPRES - DOSED IN MG/24 HR) 0.3 mg/24hr patch Place 1 patch (0.3 mg total) onto the skin once a week. 04/29/14   Sinda Du, MD  diphenhydrAMINE (BENADRYL) 25 MG tablet Take 50 mg by mouth as needed for allergies.     Historical Provider, MD  docusate sodium (COLACE) 100 MG capsule Take 2 capsules (200 mg total) by mouth daily. 11/20/13   Rogene Houston, MD  ibuprofen (ADVIL,MOTRIN) 200 MG tablet Take 600 mg by mouth every 6 (six) hours as needed for moderate pain.    Historical Provider, MD  levothyroxine (SYNTHROID, LEVOTHROID) 200 MCG  tablet Take 200 mcg by mouth daily before breakfast.    Historical Provider, MD  lisinopril (PRINIVIL,ZESTRIL) 10 MG tablet Take 10 mg by mouth daily.    Historical Provider, MD  metoprolol succinate (TOPROL-XL) 25 MG 24 hr tablet Take 25 mg by mouth daily.    Historical Provider, MD  ondansetron (ZOFRAN) 8 MG tablet Take 1 tablet (8 mg total) by mouth every 8 (eight) hours as needed. 04/11/14   Ripley Fraise, MD   Vital signs no pulse, no spontaneous respirations   Physical Exam  Constitutional: She appears well-developed and well-nourished.  HENT:  Head: Normocephalic and atraumatic.  Right Ear: External ear normal.  Left Ear: External ear normal.  Nose: Nose normal. No mucosal edema or rhinorrhea.  Mouth/Throat:  Oropharynx is clear and moist and mucous membranes are normal. No dental abscesses or uvula swelling.  Patient's noted to have a prominent tongue that is outside of her mouth.  Eyes: Conjunctivae and EOM are normal. Pupils are equal, round, and reactive to light.  Patient has some slight bulging of her eyes right is worse than the left  Neck: Neck supple.  Cardiovascular: Exam reveals no gallop and no friction rub.   No murmur heard. No heart tones heard  Pulmonary/Chest: Effort normal. No respiratory distress. She has decreased breath sounds. She has no wheezes. She has no rhonchi. She has no rales. She exhibits no tenderness and no crepitus.  Patient was being bagged with minimal air movement  Abdominal: Normal appearance.  Musculoskeletal: Normal range of motion. She exhibits no edema or tenderness.  Skin: Skin is warm, dry and intact. No rash noted. No erythema. No pallor.  Vitiligo present  Psychiatric: She is communicative.  Nursing note and vitals reviewed.   ED Course  Procedures (including critical care time)  CPR was started. Patient initially had some complexes on the monitor however she started losing a pulse. She was started on epinephrine every 5 minutes and she was given atropine. She initially had a rhythm health for during the course of her ED stay her heart rate came very slow and did not respond to the epinephrine and atropine. I attempted intubation with a glide scope several times. I initially started out with a 6.5 ET tube. I was able to see the very swollen aryepiglottic folds. I could start to put the tip of the tube in the airway however it would not advance. I tried again with a #5 ET tube at least 3 times and again I could not get the tube to advance through the airway although I could see the airway when I tried to advance the tube it would slide out. I attempted with the bougie and I could not get the bougie in the right area. The nurse anesthetist arrived and she  attempted also without success. During the course of our treatment swelling of her tongue did appear to slightly improve. She was easier to bag and her pulse ox was in the 90s. However her airway never became easier to try to access.  03:26 AM patient was pronounced. She had been in asystole for over 20 minutes.  03:42 Vicente Males, [place], states not a ME case  04:30 AM per Octavia Bruckner, house supervisor, Vicente Males called back and she may be a coroners case.   06:05 Vicente Males re contacted. States he hasn't heard yet if she will be a ME case. States he may not know until about 9 am. He states it wasn't clear what precipitated  the event such as a bug bite, I pointed out to him that she has been on lisinopril which is ACE inhibitor which classically causes the tongue swelling and airway swelling that she displayed. He states he will pass that on an let us know later this morning whether she will be a image case including autopsy or just a visual inspection by himself or if she will not be a ME case.  07:00 Dr Luan Pulling will sign death certificate if she is not a ME case.   Cricothyroidotomy attemped with air bubbles seen in the blood around the exposed airway, I was able to pass the stylet easily, but unable to get the tube over the stylet to go into the airway, it appeared to go into the adjacent spaces,  Cardiopulmonary Resuscitation (CPR) Procedure Note Directed/Performed by: Rolland Porter L I personally directed ancillary staff and/or performed CPR in an effort to regain return of spontaneous circulation and to maintain cardiac, neuro and systemic perfusion.    INTUBATION Performed by: Rolland Porter L  Required items: required blood products, implants, devices, and special equipment available Patient identity confirmed: provided demographic data and hospital-assigned identification number Time out: Immediately prior to procedure a "time out" was called to verify the correct patient, procedure, equipment, support  staff and site/side marked as required.  Indications: respiratory distress  Intubation method: Glidescope Laryngoscopy   Preoxygenation: BVM  Sedatives:none Paralytic: none  Tube Size: # 6.5 cuffed, # 5.0 uncuffed       MDM   Final diagnoses:  Angioedema, initial encounter  Adverse reaction to ACE inhibitor drug, initial encounter  Respiratory arrest [facility])    Patient expired.    Rolland Porter, MD, Barbette Or, MD Feb 25, 2015 [phone]

## 2015-03-23 NOTE — ED Notes (Signed)
Pt called EMS and stated that she was having an allergic reaction, had taken 2 benadryl and was having increase in difficulty breathing.  EMS arrived to find pt with significant swelling to face, tongue, and airway but was still walking and talking.  EMS gave 0.3 mg Epi X 2, 125 mg Solumedrol, and 1 albuterol treatment.  Just prior to arrival pt experienced sudden increase in swelling and lost consciousness.

## 2015-03-23 NOTE — Code Documentation (Signed)
Organ procurement team notified.

## 2015-03-23 NOTE — ED Notes (Signed)
Pt arrived by [facility] for c/o difficulty breathing; ems reports pt was alert when they arrived but upon arrival to  ED pt was found to be unresponsive and no pulse; CPR started

## 2015-03-23 DEATH — deceased
